# Patient Record
Sex: Female | Born: 1995 | Race: Black or African American | Hispanic: No | Marital: Single | Smoking: Never smoker
Health system: Southern US, Community
[De-identification: ages and names within clinical notes are randomized; demographics above are authoritative.]

## PROBLEM LIST (undated history)

## (undated) ENCOUNTER — Emergency Department (HOSPITAL_COMMUNITY): Payer: BC Managed Care – PPO

## (undated) DIAGNOSIS — D649 Anemia, unspecified: Secondary | ICD-10-CM

## (undated) DIAGNOSIS — N83209 Unspecified ovarian cyst, unspecified side: Secondary | ICD-10-CM

## (undated) DIAGNOSIS — F419 Anxiety disorder, unspecified: Secondary | ICD-10-CM

## (undated) DIAGNOSIS — F329 Major depressive disorder, single episode, unspecified: Secondary | ICD-10-CM

## (undated) DIAGNOSIS — Z87440 Personal history of urinary (tract) infections: Secondary | ICD-10-CM

## (undated) DIAGNOSIS — Z975 Presence of (intrauterine) contraceptive device: Secondary | ICD-10-CM

## (undated) DIAGNOSIS — Z8709 Personal history of other diseases of the respiratory system: Secondary | ICD-10-CM

## (undated) DIAGNOSIS — F32A Depression, unspecified: Secondary | ICD-10-CM

## (undated) HISTORY — PX: OTHER SURGICAL HISTORY: SHX169

## (undated) HISTORY — PX: HERNIA REPAIR: SHX51

## (undated) HISTORY — DX: Anxiety disorder, unspecified: F41.9

## (undated) HISTORY — DX: Major depressive disorder, single episode, unspecified: F32.9

## (undated) HISTORY — DX: Personal history of urinary (tract) infections: Z87.440

## (undated) HISTORY — DX: Depression, unspecified: F32.A

## (undated) HISTORY — DX: Presence of (intrauterine) contraceptive device: Z97.5

## (undated) HISTORY — DX: Personal history of other diseases of the respiratory system: Z87.09

---
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
## 2017-01-25 ENCOUNTER — Ambulatory Visit (INDEPENDENT_AMBULATORY_CARE_PROVIDER_SITE_OTHER): Payer: BC Managed Care – PPO | Admitting: Family Medicine

## 2017-01-25 ENCOUNTER — Encounter: Payer: Self-pay | Admitting: Family Medicine

## 2017-01-25 VITALS — BP 100/70 | HR 54 | Resp 12 | Ht 69.0 in | Wt 136.5 lb

## 2017-01-25 DIAGNOSIS — R001 Bradycardia, unspecified: Secondary | ICD-10-CM

## 2017-01-25 DIAGNOSIS — F331 Major depressive disorder, recurrent, moderate: Secondary | ICD-10-CM | POA: Insufficient documentation

## 2017-01-25 DIAGNOSIS — F419 Anxiety disorder, unspecified: Secondary | ICD-10-CM | POA: Insufficient documentation

## 2017-01-25 MED ORDER — ESCITALOPRAM OXALATE 10 MG PO TABS: 10.0000 mg | ORAL_TABLET | Freq: Every day | ORAL | 1 refills | Status: DC

## 2017-01-25 NOTE — Progress Notes (Signed)
HPI:   [name] is a 21 y.o. female, who is here today to establish care with me.  Former PCP: N/A Last preventive routine visit: 2 years ago, she saw her pediatrician. She follows with Dr Su Hilt, gyn, regularly.  Chronic medical problems:Recurrent UTI's and anxiety.  She is going to her 3rd year college at [employer], her major is psychology business concentration. She is here in town living with parents during school break.  Concerns today: Anxiety   She is reporting years Hx of anxiety and depression, her pediatrician was treating her with Vyvanse and Zoloft. She states that she was initially started on Vuvanse low dose,which aggravated her anxiety, so Zoloft was added. She took Zoloft for 1.5 years but did not feel like it help.  She denies any Hx of ADD or ADHD, states that her parents were concerned about her academic performance at the time. She was a honored students until she suffered head concussion in 2014, then she had some issues with learning, she did not go back to her baseline but still able to graduate with her class above average.  She attributes anxiety and depression to a series of events that took place while growing up. She starts with telling me that her parents are professionals, she is the only child, so she felt a lot of pressure when growing up in regard to academic achievements. She went to private school with "white kids", frequently mean and looking at her like she was different.She denies physical abuse. States that she was not able to overcome all these difficulties and "let things go." She received counseling at schools, reports suicidal thoughts while she was in high school but never attempt or had a plan. She has tried to talk with her parents a few times but they do not understand, usually their comments towards her make her feel worse. If she does not do what they ask her to do she is told she will not be  successful. She tells me that what she "really wanted so bad" was to play volleyball , she was very good, she was accepted in several schools to play with their teams but her parents did not allow and "mde" her quit volleyball for good. She is now in dancing classes,which she used to do but her parents do not approve it.  She sleeps well, 5-6 hours, takes a daily an hour nap. Sexually active, states that it can happen at any time if she is dating but "always" uses condom.  She thinks her mother may have depression issues. States that her parents do not believe in mood/psych disorders. She did not do well this year, she missed classes, did not feel like going to class. Her best friend has Hx of bipolar, well controlled with medication. She denies tobacco use,alcohol use,or illicit drug use.   Review of Systems  Constitutional: Negative for activity change, appetite change, fatigue, fever and unexpected weight change.  HENT: Negative for mouth sores, nosebleeds and trouble swallowing.   Respiratory: Negative for shortness of breath and wheezing.   Cardiovascular: Negative for chest pain, palpitations and leg swelling.  Gastrointestinal: Negative for abdominal pain, nausea and vomiting.       Negative for changes in bowel habits.  Endocrine: Negative for cold intolerance and heat intolerance.  Genitourinary: Negative for decreased urine volume and hematuria.  Neurological: Negative for tremors, syncope, weakness and headaches.  Psychiatric/Behavioral: Negative for confusion, hallucinations, sleep disturbance and suicidal ideas.  The patient is nervous/anxious.      No current outpatient prescriptions on file prior to visit.   No current facility-administered medications on file prior to visit.     Past Medical History:  Diagnosis Date  . Anxiety   . Depression   . History of frequent urinary tract infections    No Known Allergies  Family History  Problem Relation Age of Onset  .  Diabetes Father   . Cancer Maternal Grandfather        prostate    Social History   Social History  . Marital status: Single    Spouse name: N/A  . Number of children: N/A  . Years of education: N/A   Social History Main Topics  . Smoking status: Never Smoker  . Smokeless tobacco: Never Used  . Alcohol use No  . Drug use: No  . Sexual activity: Yes    Birth control/ protection: Condom   Other Topics Concern  . None   Social History Narrative  . None    Vitals:   01/25/17 1522  BP: 100/70  Pulse: (!) 54  Resp: 12   O2 sat at RA 98% Body mass index is 20.16 kg/m.   Physical Exam  Nursing note and vitals reviewed. Constitutional: She is oriented to person, place, and time. She appears well-developed and well-nourished. No distress.  HENT:  Head: Atraumatic.  Mouth/Throat: Oropharynx is clear and moist and mucous membranes are normal.  Eyes: Conjunctivae and EOM are normal. Pupils are equal, round, and reactive to light.  Neck: No tracheal deviation present. No thyroid mass and no thyromegaly present.  Cardiovascular: Regular rhythm.  Bradycardia present.   No murmur heard. Pulses:      Dorsalis pedis pulses are 2+ on the right side, and 2+ on the left side.  Respiratory: Effort normal and breath sounds normal. No respiratory distress.  GI: Soft. She exhibits no mass. There is no hepatomegaly. There is no tenderness.  Musculoskeletal: She exhibits no edema.  Lymphadenopathy:    She has no cervical adenopathy.  Neurological: She is alert and oriented to person, place, and time. She has normal strength. Coordination and gait normal.  Skin: Skin is warm. No erythema.  Psychiatric: Her mood appears anxious. Her affect is labile. She expresses no suicidal ideation. She expresses no suicidal plans.  Well groomed, good eye contact.      ASSESSMENT AND PLAN:   Susette was seen today for establish care.  Diagnoses and all orders for this visit:  Depression,  major, recurrent, moderate (HCC)  We discussed treatment options and some side effects. She agrees with trying Lexapro. Denies suicidal thoughts at this time, clearly instructed about warning signs. F/U in 3-4 weeks.  -     escitalopram (LEXAPRO) 10 MG tablet; Take 1 tablet (10 mg total) by mouth daily.  Anxiety disorder, unspecified type  She is interested in psychotherapy, recommend setting up appt with Dr Jason FilaBray.  -     escitalopram (LEXAPRO) 10 MG tablet; Take 1 tablet (10 mg total) by mouth daily.  Sinus bradycardia  Asymptomatic. Next OV planning on lab work.  Instructed about warning signs.  36 min face to face OV. > 50% was dedicated to counseling about Dx's, treatment options, and discussion of side effects of medication.She voices understanding and agrees with plan. Strategies for better communication with parents, learning to understand their position but at the same time working on her own goals.      Timoteo ExposeBetty G.  [place], [place]. [place] office.

## 2017-01-25 NOTE — Patient Instructions (Signed)
A few things to remember from today's visit:   Anxiety disorder, unspecified type - Plan: escitalopram (LEXAPRO) 10 MG tablet  Today we started Lexapro, this type of medications can increase suicidal risk. This is more prevalent among children,adolecents, and young adults with major depression or other psychiatric disorders. It can also make depression worse. Most common side effects are gastrointestinal, self limited after a few weeks: diarrhea, nausea, constipation  Or diarrhea among some.  In general it is well tolerated. We will follow closely.       Please be sure medication list is accurate. If a new problem present, please set up appointment sooner than planned today.

## 2017-02-04 ENCOUNTER — Ambulatory Visit (INDEPENDENT_AMBULATORY_CARE_PROVIDER_SITE_OTHER): Payer: Self-pay | Admitting: Nurse Practitioner

## 2017-02-04 VITALS — BP 98/82 | HR 61 | Temp 99.3°F | Wt 134.0 lb

## 2017-02-04 DIAGNOSIS — J029 Acute pharyngitis, unspecified: Secondary | ICD-10-CM

## 2017-02-04 DIAGNOSIS — J028 Acute pharyngitis due to other specified organisms: Secondary | ICD-10-CM

## 2017-02-04 DIAGNOSIS — B9789 Other viral agents as the cause of diseases classified elsewhere: Secondary | ICD-10-CM

## 2017-02-04 LAB — POCT RAPID STREP A (OFFICE): Rapid Strep A Screen: NEGATIVE

## 2017-02-04 MED ORDER — MAGIC MOUTHWASH W/LIDOCAINE: 5.0000 mL | Freq: Three times a day (TID) | ORAL | 0 refills | Status: AC | PRN

## 2017-02-04 NOTE — Patient Instructions (Addendum)
Sore Throat A sore throat is pain, burning, irritation, or scratchiness in the throat. When you have a sore throat, you may feel pain or tenderness in your throat when you swallow or talk. Many things can cause a sore throat, including:  An infection.  Seasonal allergies.  Dryness in the air.  Irritants, such as smoke or pollution.  Gastroesophageal reflux disease (GERD).  A tumor.  A sore throat is often the first sign of another sickness. It may happen with other symptoms, such as coughing, sneezing, fever, and swollen neck glands. Most sore throats go away without medical treatment. Follow these instructions at home:  Take over-the-counter medicines only as told by your health care provider.  Drink enough fluids to keep your urine clear or pale yellow.  Saltwater gargles to help with throat pain.  Rest as needed.  To help with pain, try: ? Sipping warm liquids, such as broth, herbal tea, or warm water. ? Eating or drinking cold or frozen liquids, such as frozen ice pops. ? Gargling with a salt-water mixture 3-4 times a day or as needed. To make a salt-water mixture, completely dissolve -1 tsp of salt in 1 cup of warm water. ? Sucking on hard candy or throat lozenges. ? Putting a cool-mist humidifier in your bedroom at night to moisten the air. ? Sitting in the bathroom with the door closed for 5-10 minutes while you run hot water in the shower.  Do not use any tobacco products, such as cigarettes, chewing tobacco, and e-cigarettes. If you need help quitting, ask your health care provider.  Ibuprofen or Tylenol for pain, fever or general discomfort.  Use Magic Mouthwash as directed for severe throat pain. Contact a health care provider if:  You have a fever for more than 2-3 days.  You have symptoms that last (are persistent) for more than 2-3 days.  Your throat does not get better within 7 days.  You have a fever and your symptoms suddenly get worse. Get help  right away if:  You have difficulty breathing.  You cannot swallow fluids, soft foods, or your saliva.  You have increased swelling in your throat or neck.  You have persistent nausea and vomiting. This information is not intended to replace advice given to you by your health care provider. Make sure you discuss any questions you have with your health care provider. Document Released: 10/01/2004 Document Revised: 04/19/2016 Document Reviewed: 06/14/2015 Elsevier Interactive Patient Education  [employer]2018 Elsevier Inc.

## 2017-02-04 NOTE — Progress Notes (Signed)
Subjective:     Ruth Lewis is a 21 y.o. female who presents for evaluation of sore throat. Associated symptoms include sore throat and swollen glands. Onset of symptoms was today, and have been unchanged since that time. She is drinking plenty of fluids. She has had a recent close exposure to someone with proven streptococcal pharyngitis.  The patient's last diagnosis of strep throat was two years ago at which time she had 3 occurrences.    The following portions of the patient's history were reviewed and updated as appropriate: allergies, current medications and past medical history.  Review of Systems Constitutional: positive for fatigue Eyes: negative Ears, nose, mouth, throat, and face: positive for sore throat, voice change and swollen glands Respiratory: negative Cardiovascular: negative Allergic/Immunologic: negative    Objective:    BP 98/82 (BP Location: Left Arm, Patient Position: Sitting, Cuff Size: Normal)   Pulse 61   Temp 99.3 F (37.4 C) (Oral)   Wt 134 lb (60.8 kg)   BMI 19.79 kg/m  General appearance: alert, cooperative and no distress Head: Normocephalic, without obvious abnormality, atraumatic Eyes: conjunctivae/corneas clear. PERRL, EOM's intact. Fundi benign. Ears: normal TM's and external ear canals both ears Nose: Nares normal. Septum midline. Mucosa normal. No drainage or sinus tenderness. Throat: abnormal findings: moderate oropharyngeal erythema and no exudate Lungs: clear to auscultation bilaterally Heart: regular rate and rhythm, S1, S2 normal, no murmur, click, rub or gallop Lymph nodes: Cervical adenopathy: bilaterally, mild  Laboratory Strep test performed. Results:negative.    Assessment:   Viral pharyngitis.    Plan:    Use of OTC analgesics recommended as well as salt water gargles. Magic mouthwash, take as directed.  Ibuprofen or Tylenol for pain, fever or general discomfort.  Plenty of fluids.  Go to ER if difficulty swallowing,  breathing or other concerns.  Patient verbalizes instructions.

## 2017-02-05 ENCOUNTER — Telehealth: Payer: Self-pay | Admitting: Nurse Practitioner

## 2017-02-05 NOTE — Telephone Encounter (Signed)
Called patient to follow up on her status.  Spoke with patient. Patient states she is feeling much better today and has not need to use the mouthwash.  Informed patient to follow up if needed.

## 2017-02-21 NOTE — Progress Notes (Signed)
HPI:   [name] is a 21 y.o. female, who is here today to follow on recent OV.   She was seen on 01/25/17, when she was concerned about anxiety and depressed mood, which she has had for several years, aggravated by her relation with her parents. [name] in the past, did not help.  [name] 10 mg daily, which she feels has helped.  Psychotherapy was also recommended but she did not have time to set up an appt and does not feel like it will make a difference. She has done psychotherapy in the past, states that she does not have time now. She is working and taking some on line classes.  Sleeping better,  11:30 pm 9 am, feels rested. "Choosing her battle" now, able to controlled her impulsive "mood", irritability, not arguing with parents as much as she did before. No suicidal thought.  She has no concerns today.   Review of Systems  Constitutional: Negative for activity change, appetite change, fatigue and unexpected weight change.  Respiratory: Negative for chest tightness and shortness of breath.   Cardiovascular: Negative for chest pain and palpitations.  Gastrointestinal: Negative for abdominal pain, diarrhea, nausea and vomiting.  Skin: Negative for rash.  Neurological: Negative for tremors, seizures and headaches.  Psychiatric/Behavioral: Negative for confusion, hallucinations, sleep disturbance and suicidal ideas. The patient is not nervous/anxious.       Current Outpatient Prescriptions on File Prior to Visit  Medication Sig Dispense Refill  . escitalopram (LEXAPRO) 10 MG tablet Take 1 tablet (10 mg total) by mouth daily. 30 tablet 1   No current facility-administered medications on file prior to visit.      Past Medical History:  Diagnosis Date  . Anxiety   . Depression   . History of frequent urinary tract infections    No Known Allergies  Social History   Social History  . Marital status: Unknown    Spouse  name: N/A  . Number of children: N/A  . Years of education: N/A   Social History Main Topics  . Smoking status: Never Smoker  . Smokeless tobacco: Never Used  . Alcohol use No  . Drug use: No  . Sexual activity: Yes    Birth control/ protection: Condom   Other Topics Concern  . None   Social History Narrative  . None    Vitals:   02/22/17 1033  BP: 100/70  Pulse: 62  Resp: 12   Body mass index is 20.31 kg/m.   Wt Readings from Last 3 Encounters:  02/22/17 137 lb 8 oz (62.4 kg)  02/04/17 134 lb (60.8 kg)  01/25/17 136 lb 8 oz (61.9 kg)     Physical Exam  Nursing note and vitals reviewed. Constitutional: She is oriented to person, place, and time. She appears well-developed. No distress.  HENT:  Mouth/Throat: Oropharynx is clear and moist and mucous membranes are normal.  Eyes: Conjunctivae are normal.  Cardiovascular: Normal rate and regular rhythm.   No murmur heard. Respiratory: Effort normal and breath sounds normal. No respiratory distress.  Musculoskeletal: She exhibits no edema.  Neurological: She is alert and oriented to person, place, and time. She has normal strength. Gait normal.  Skin: Skin is warm. No erythema.  Psychiatric: She has a normal mood and affect. Her speech is normal. Cognition and memory are normal. She expresses no suicidal ideation. She expresses no suicidal plans.  Well groomed,good eye contact.  ASSESSMENT AND PLAN:   Gabreille was seen today for follow-up.  Diagnoses and all orders for this visit:  Anxiety disorder, unspecified type  Depression, major, recurrent, moderate (HCC)   Reporting improvement of symptoms and mood with Lexapro 10 mg. She is tolerating med well, no maniac like symptoms and sleeping better. She is not interested in psychotherapy for now.  No changes in Lexapro dose.  Instructed about warning signs. F/U in 3-4 months,before if needed.     Jahquez Steffler G. [place], MD  [facility]. [place]  office.

## 2017-02-22 ENCOUNTER — Encounter: Payer: Self-pay | Admitting: Family Medicine

## 2017-02-22 ENCOUNTER — Ambulatory Visit (INDEPENDENT_AMBULATORY_CARE_PROVIDER_SITE_OTHER): Payer: BC Managed Care – PPO | Admitting: Family Medicine

## 2017-02-22 VITALS — BP 100/70 | HR 62 | Resp 12 | Ht 69.0 in | Wt 137.5 lb

## 2017-02-22 DIAGNOSIS — F419 Anxiety disorder, unspecified: Secondary | ICD-10-CM

## 2017-02-22 DIAGNOSIS — F331 Major depressive disorder, recurrent, moderate: Secondary | ICD-10-CM | POA: Diagnosis not present

## 2017-02-22 MED ORDER — ESCITALOPRAM OXALATE 10 MG PO TABS: 10.0000 mg | ORAL_TABLET | Freq: Every day | ORAL | 1 refills | Status: DC

## 2017-02-22 NOTE — Patient Instructions (Addendum)
A few things to remember from today's visit:   Anxiety disorder, unspecified type - Plan: escitalopram (LEXAPRO) 10 MG tablet  Depression, major, recurrent, moderate (HCC) - Plan: escitalopram (LEXAPRO) 10 MG tablet   [name], today we have followed on some of your chronic medical problems and they seem to be stable, so no changes in current management today.  Review medication list and be sure it is accurate.  -Remember a healthy diet and regular physical activity are very important for prevention as well as for well being; they also help with many chronic problems, decreasing the need of adding new medications and delaying or preventing possible complications.  I will see you back in 4 months.  Remember to arrange your follow up appt before leaving today.  Please follow sooner than planned if a new concern arises.  Please be sure medication list is accurate. If a new problem present, please set up appointment sooner than planned today.

## 2017-03-31 ENCOUNTER — Other Ambulatory Visit: Payer: Self-pay | Admitting: Obstetrics and Gynecology

## 2017-04-01 ENCOUNTER — Encounter (HOSPITAL_COMMUNITY): Payer: Self-pay | Admitting: *Deleted

## 2017-04-05 NOTE — H&P (Signed)
Ruth Lewis is a 21 y.o.  female, G: 0 who  presents for ovarian cystectomy because of  a persistent and enlarging right ovarian cyst.  Over the past 3 years the patient has been evaluated for irregular bleeding that would, at times lasts for 40 consecutive days and require her to change her pad 4 times a day.  Additionally, she would experience menstrual cramping that she rated 7/10 on a 10 point pain scale with minimal relief from over the counter Advil. Consequently she was placed on Nexplanon in 2016 with some improvement in her bleeding pattern but the irregularity continued. In January 2018 the patient had a sono-hysterogram that showed:  anteverted uterus measuring 5.33 x 3.87 x 2.80 cm, endometrium 0.86 mm with no lesions; left ovary: 2.91 cm and right ovary: 4.43 cm with a 3.2 cm simple appearing cyst with normal Dopplers.  The patient denies any pelvic pain (that is not associated with bleeding), changes in bowel or bladder function or lower back pain.  A repeat pelvic ultrasound in July 2018 revealed an increase in the right ovarian cyst to 5.2 cm. Patient's TSH, Prolactin and  Ca-125 were within normal range and H/H = 14.2/43.1.  A review of both medical and surgical management options were given to the patient for management of her ovarian cyst, however, given the progression of growth, the patient has opted to have her cyst removed.   Past Medical History  OB History: G:0  GYN History: menarche: 21 YO    LMP: 03/19/2016    Contracepton Nexplanon  The patient denies history of sexually transmitted disease.    Medical History:  Anxiety, Vitamin D Deficiency and Leukopenia,   Surgical History: 2000 Umbilical Hernia Repair Denies problems with anesthesia or history of blood transfusions  Family History:  Anemia, Hypertension, Diabetes Mellitus and Prostate Cancer   Social History:  Single and a Consulting civil engineertudent at [employer];  Denies tobacco or alcohol  use   Medications: Vitamin D 3 50,000 units weekly Lexapro 10 mg  daily Nexplanon 68 mg Sub-dermal Implant (placed 04/15/15)   No Known Allergies   Denies sensitivity to peanuts, shellfish, soy, latex or adhesives.   ROS: Admits to glasses but denies headache, vision changes, nasal congestion, dysphagia, tinnitus, dizziness, hoarseness, cough,  chest pain, shortness of breath, nausea, vomiting, diarrhea,constipation,  urinary frequency, urgency  dysuria, hematuria, vaginitis symptoms, pelvic pain, swelling of joints,easy bruising,  myalgias, arthralgias, skin rashes, unexplained weight loss and except as is mentioned in the history of present illness, patient's review of systems is otherwise negative.   Physical Exam  Bp: 106/88    P: 60  bpm  Temperature:  99.14 degree F orally    Weight:  141 lbs.  Height: 5\' 9"   BMI: 20.8  Neck: supple without masses or thyromegaly Lungs: clear to auscultation Heart: regular rate and rhythm Abdomen: soft, non-tender and no organomegaly Pelvic: patient refused pelvic exam Extremities:  no clubbing, cyanosis or edema   Assesment:  Persistent, Enlarging Right Ovarian Cyst   Disposition:  A discussion was held with patient regarding the indication for her procedure(s) along with the risks, which include but are not limited to: reaction to anesthesia, damage to adjacent organs, infection and excessive bleeding.  The patient verbalized understanding of these risks and has consented to proceed with a Laparoscopic Right [facility]ian [facility] on April 07, 2017 at 10:30 a.m.    CSN# [id number]   Tori Dattilio J. Lowell GuitarPowell, PA-C  for Dr. Woodroe ModeAngela Y. Su Hiltoberts

## 2017-04-07 ENCOUNTER — Encounter (HOSPITAL_COMMUNITY)
Admission: RE | Disposition: A | Discharge status: Home / Self Care | Payer: Self-pay | Source: Ambulatory Visit | Attending: Obstetrics and Gynecology

## 2017-04-07 ENCOUNTER — Ambulatory Visit (HOSPITAL_COMMUNITY): Payer: BC Managed Care – PPO | Admitting: Anesthesiology

## 2017-04-07 ENCOUNTER — Encounter (HOSPITAL_COMMUNITY): Payer: Self-pay | Admitting: Certified Registered Nurse Anesthetist

## 2017-04-07 ENCOUNTER — Ambulatory Visit (HOSPITAL_COMMUNITY)
Admission: RE | Admit: 2017-04-07 | Discharge: 2017-04-07 | Disposition: A | Discharge status: Home / Self Care | Payer: BC Managed Care – PPO | Source: Ambulatory Visit | Attending: Obstetrics and Gynecology | Admitting: Obstetrics and Gynecology

## 2017-04-07 DIAGNOSIS — F329 Major depressive disorder, single episode, unspecified: Secondary | ICD-10-CM | POA: Diagnosis not present

## 2017-04-07 DIAGNOSIS — Z79899 Other long term (current) drug therapy: Secondary | ICD-10-CM | POA: Diagnosis not present

## 2017-04-07 DIAGNOSIS — Z793 Long term (current) use of hormonal contraceptives: Secondary | ICD-10-CM | POA: Diagnosis not present

## 2017-04-07 DIAGNOSIS — F419 Anxiety disorder, unspecified: Secondary | ICD-10-CM | POA: Diagnosis not present

## 2017-04-07 DIAGNOSIS — N83201 Unspecified ovarian cyst, right side: Secondary | ICD-10-CM

## 2017-04-07 DIAGNOSIS — E559 Vitamin D deficiency, unspecified: Secondary | ICD-10-CM | POA: Insufficient documentation

## 2017-04-07 DIAGNOSIS — N8301 Follicular cyst of right ovary: Secondary | ICD-10-CM | POA: Diagnosis not present

## 2017-04-07 HISTORY — DX: Anemia, unspecified: D64.9

## 2017-04-07 HISTORY — DX: Unspecified ovarian cyst, unspecified side: N83.209

## 2017-04-07 HISTORY — PX: LAPAROSCOPIC OVARIAN CYSTECTOMY: SHX6248

## 2017-04-07 LAB — CBC
HCT: 41 % (ref 36.0–46.0)
Hemoglobin: 13.8 g/dL (ref 12.0–15.0)
MCH: 29.6 pg (ref 26.0–34.0)
MCHC: 33.7 g/dL (ref 30.0–36.0)
MCV: 88 fL (ref 78.0–100.0)
PLATELETS: 243 10*3/uL (ref 150–400)
RBC: 4.66 MIL/uL (ref 3.87–5.11)
RDW: 13.9 % (ref 11.5–15.5)
WBC: 3.4 10*3/uL — AB (ref 4.0–10.5)

## 2017-04-07 LAB — HCG, SERUM, QUALITATIVE: PREG SERUM: NEGATIVE

## 2017-04-07 SURGERY — EXCISION, CYST, OVARY, LAPAROSCOPIC
Anesthesia: General | Site: Abdomen | Laterality: Right

## 2017-04-07 MED ORDER — SCOPOLAMINE 1 MG/3DAYS TD PT72
MEDICATED_PATCH | TRANSDERMAL | Status: AC
  Filled 2017-04-07: qty 1

## 2017-04-07 MED ORDER — SCOPOLAMINE 1 MG/3DAYS TD PT72
1.0000 | MEDICATED_PATCH | Freq: Once | TRANSDERMAL | Status: DC
Start: 2017-04-07 — End: 2017-04-07
  Administered 2017-04-07: 1.5 mg via TRANSDERMAL

## 2017-04-07 MED ORDER — PROPOFOL 10 MG/ML IV BOLUS
INTRAVENOUS | Status: DC | PRN
  Administered 2017-04-07: 200 mg via INTRAVENOUS

## 2017-04-07 MED ORDER — FENTANYL CITRATE (PF) 100 MCG/2ML IJ SOLN
INTRAMUSCULAR | Status: AC
  Filled 2017-04-07: qty 2

## 2017-04-07 MED ORDER — CEFAZOLIN SODIUM-DEXTROSE 2-3 GM-% IV SOLR
INTRAVENOUS | Status: DC | PRN
  Administered 2017-04-07: 2 g via INTRAVENOUS

## 2017-04-07 MED ORDER — BUPIVACAINE HCL (PF) 0.25 % IJ SOLN
INTRAMUSCULAR | Status: DC | PRN
  Administered 2017-04-07: 18 mL

## 2017-04-07 MED ORDER — HYDROCODONE-ACETAMINOPHEN 5-325 MG PO TABS: ORAL_TABLET | ORAL | 0 refills | Status: DC

## 2017-04-07 MED ORDER — LACTATED RINGERS IV SOLN
INTRAVENOUS | Status: DC
  Administered 2017-04-07: 12:00:00 via INTRAVENOUS
  Administered 2017-04-07: 125 mL/h via INTRAVENOUS

## 2017-04-07 MED ORDER — MIDAZOLAM HCL 2 MG/2ML IJ SOLN
INTRAMUSCULAR | Status: AC
  Filled 2017-04-07: qty 2

## 2017-04-07 MED ORDER — FENTANYL CITRATE (PF) 100 MCG/2ML IJ SOLN
INTRAMUSCULAR | Status: DC | PRN
  Administered 2017-04-07 (×2): 50 ug via INTRAVENOUS
  Administered 2017-04-07: 100 ug via INTRAVENOUS
  Administered 2017-04-07: 50 ug via INTRAVENOUS

## 2017-04-07 MED ORDER — KETOROLAC TROMETHAMINE 30 MG/ML IJ SOLN
INTRAMUSCULAR | Status: AC
  Filled 2017-04-07: qty 1

## 2017-04-07 MED ORDER — BUPIVACAINE HCL (PF) 0.25 % IJ SOLN
INTRAMUSCULAR | Status: AC
  Filled 2017-04-07: qty 30

## 2017-04-07 MED ORDER — LIDOCAINE HCL (PF) 1 % IJ SOLN
INTRAMUSCULAR | Status: AC
  Filled 2017-04-07: qty 5

## 2017-04-07 MED ORDER — ONDANSETRON HCL 4 MG/2ML IJ SOLN: 4.0000 mg | Freq: Once | INTRAMUSCULAR | Status: DC | PRN

## 2017-04-07 MED ORDER — HYDROCODONE-ACETAMINOPHEN 5-325 MG PO TABS
ORAL_TABLET | ORAL | Status: AC
  Filled 2017-04-07: qty 1

## 2017-04-07 MED ORDER — MIDAZOLAM HCL 2 MG/2ML IJ SOLN
INTRAMUSCULAR | Status: DC | PRN
  Administered 2017-04-07: 2 mg via INTRAVENOUS

## 2017-04-07 MED ORDER — KETOROLAC TROMETHAMINE 30 MG/ML IJ SOLN
INTRAMUSCULAR | Status: DC | PRN
  Administered 2017-04-07: 30 mg via INTRAVENOUS

## 2017-04-07 MED ORDER — DEXAMETHASONE SODIUM PHOSPHATE 10 MG/ML IJ SOLN
INTRAMUSCULAR | Status: DC | PRN
  Administered 2017-04-07: 10 mg via INTRAVENOUS

## 2017-04-07 MED ORDER — ROCURONIUM BROMIDE 100 MG/10ML IV SOLN
INTRAVENOUS | Status: DC | PRN
  Administered 2017-04-07: 5 mg via INTRAVENOUS
  Administered 2017-04-07: 40 mg via INTRAVENOUS
  Administered 2017-04-07: 5 mg via INTRAVENOUS

## 2017-04-07 MED ORDER — ONDANSETRON HCL 4 MG/2ML IJ SOLN
INTRAMUSCULAR | Status: AC
  Filled 2017-04-07: qty 2

## 2017-04-07 MED ORDER — ROCURONIUM BROMIDE 100 MG/10ML IV SOLN
INTRAVENOUS | Status: AC
  Filled 2017-04-07: qty 1

## 2017-04-07 MED ORDER — GLYCOPYRROLATE 0.2 MG/ML IJ SOLN
INTRAMUSCULAR | Status: AC
  Filled 2017-04-07: qty 1

## 2017-04-07 MED ORDER — ONDANSETRON HCL 4 MG/2ML IJ SOLN
INTRAMUSCULAR | Status: DC | PRN
  Administered 2017-04-07: 4 mg via INTRAVENOUS

## 2017-04-07 MED ORDER — HYDROCODONE-ACETAMINOPHEN 5-325 MG PO TABS
1.0000 | ORAL_TABLET | Freq: Once | ORAL | Status: AC
Start: 2017-04-07 — End: 2017-04-07
  Administered 2017-04-07: 1 via ORAL

## 2017-04-07 MED ORDER — GLYCOPYRROLATE 0.2 MG/ML IJ SOLN
INTRAMUSCULAR | Status: DC | PRN
  Administered 2017-04-07: 0.2 mg via INTRAVENOUS

## 2017-04-07 MED ORDER — FENTANYL CITRATE (PF) 100 MCG/2ML IJ SOLN
25.0000 ug | INTRAMUSCULAR | Status: DC | PRN
  Administered 2017-04-07: 25 ug via INTRAVENOUS

## 2017-04-07 MED ORDER — DEXAMETHASONE SODIUM PHOSPHATE 4 MG/ML IJ SOLN
INTRAMUSCULAR | Status: AC
  Filled 2017-04-07: qty 1

## 2017-04-07 MED ORDER — IBUPROFEN 600 MG PO TABS: ORAL_TABLET | ORAL | 1 refills | Status: DC

## 2017-04-07 MED ORDER — SUGAMMADEX SODIUM 200 MG/2ML IV SOLN
INTRAVENOUS | Status: DC | PRN
  Administered 2017-04-07: 150 mg via INTRAVENOUS

## 2017-04-07 MED ORDER — LIDOCAINE HCL (CARDIAC) 20 MG/ML IV SOLN
INTRAVENOUS | Status: DC | PRN
  Administered 2017-04-07: 50 mg via INTRAVENOUS

## 2017-04-07 MED ORDER — FENTANYL CITRATE (PF) 250 MCG/5ML IJ SOLN
INTRAMUSCULAR | Status: AC
  Filled 2017-04-07: qty 5

## 2017-04-07 MED ORDER — PROPOFOL 10 MG/ML IV BOLUS
INTRAVENOUS | Status: AC
  Filled 2017-04-07: qty 20

## 2017-04-07 MED ORDER — MEPERIDINE HCL 25 MG/ML IJ SOLN: 6.2500 mg | INTRAMUSCULAR | Status: DC | PRN

## 2017-04-07 MED ORDER — SODIUM CHLORIDE 0.9 % IR SOLN
Status: DC | PRN
  Administered 2017-04-07: 1000 mL

## 2017-04-07 MED ORDER — CEFAZOLIN SODIUM-DEXTROSE 2-4 GM/100ML-% IV SOLN
INTRAVENOUS | Status: AC
  Filled 2017-04-07: qty 100

## 2017-04-07 MED ORDER — SUGAMMADEX SODIUM 200 MG/2ML IV SOLN
INTRAVENOUS | Status: AC
  Filled 2017-04-07: qty 2

## 2017-04-07 SURGICAL SUPPLY — 39 items
ADH SKN CLS APL DERMABOND .7 (GAUZE/BANDAGES/DRESSINGS) ×1
BAG SPEC RTRVL LRG 6X4 10 (ENDOMECHANICALS)
BARRIER ADHS 3X4 INTERCEED (GAUZE/BANDAGES/DRESSINGS) ×2 IMPLANT
BRR ADH 4X3 ABS CNTRL BYND (GAUZE/BANDAGES/DRESSINGS) ×1
CABLE HIGH FREQUENCY MONO STRZ (ELECTRODE) ×2 IMPLANT
CLOTH BEACON ORANGE TIMEOUT ST (SAFETY) ×3 IMPLANT
DERMABOND ADVANCED (GAUZE/BANDAGES/DRESSINGS) ×2
DERMABOND ADVANCED .7 DNX12 (GAUZE/BANDAGES/DRESSINGS) ×1 IMPLANT
DRSG OPSITE POSTOP 3X4 (GAUZE/BANDAGES/DRESSINGS) ×2 IMPLANT
DURAPREP 26ML APPLICATOR (WOUND CARE) ×3 IMPLANT
FORCEPS CUTTING 33CM 5MM (CUTTING FORCEPS) IMPLANT
GLOVE BIO SURGEON STRL SZ7.5 (GLOVE) ×3 IMPLANT
GLOVE BIOGEL PI IND STRL 7.0 (GLOVE) ×2 IMPLANT
GLOVE BIOGEL PI IND STRL 7.5 (GLOVE) ×2 IMPLANT
GLOVE BIOGEL PI INDICATOR 7.0 (GLOVE) ×4
GLOVE BIOGEL PI INDICATOR 7.5 (GLOVE) ×4
GOWN STRL REUS W/TWL LRG LVL3 (GOWN DISPOSABLE) ×6 IMPLANT
NEEDLE INSUFFLATION 120MM (ENDOMECHANICALS) ×3 IMPLANT
NS IRRIG 1000ML POUR BTL (IV SOLUTION) ×3 IMPLANT
PACK LAPAROSCOPY BASIN (CUSTOM PROCEDURE TRAY) ×3 IMPLANT
PACK TRENDGUARD 450 HYBRID PRO (MISCELLANEOUS) IMPLANT
PACK TRENDGUARD 600 HYBRD PROC (MISCELLANEOUS) IMPLANT
POUCH SPECIMEN RETRIEVAL 10MM (ENDOMECHANICALS) IMPLANT
PROTECTOR NERVE ULNAR (MISCELLANEOUS) ×6 IMPLANT
SET IRRIG TUBING LAPAROSCOPIC (IRRIGATION / IRRIGATOR) IMPLANT
SLEEVE XCEL OPT CAN 5 100 (ENDOMECHANICALS) ×4 IMPLANT
SOLUTION ELECTROLUBE (MISCELLANEOUS) IMPLANT
SUT MNCRL AB 3-0 PS2 27 (SUTURE) ×3 IMPLANT
SUT VICRYL 0 ENDOLOOP (SUTURE) IMPLANT
SUT VICRYL 0 TIES 12 18 (SUTURE) IMPLANT
SUT VICRYL 0 UR6 27IN ABS (SUTURE) ×3 IMPLANT
SYR 50ML LL SCALE MARK (SYRINGE) IMPLANT
TOWEL OR 17X24 6PK STRL BLUE (TOWEL DISPOSABLE) ×6 IMPLANT
TRAY FOLEY CATH SILVER 14FR (SET/KITS/TRAYS/PACK) ×3 IMPLANT
TRENDGUARD 450 HYBRID PRO PACK (MISCELLANEOUS) ×3
TRENDGUARD 600 HYBRID PROC PK (MISCELLANEOUS)
TROCAR XCEL NON-BLD 11X100MML (ENDOMECHANICALS) ×3 IMPLANT
TROCAR XCEL NON-BLD 5MMX100MML (ENDOMECHANICALS) ×3 IMPLANT
WARMER LAPAROSCOPE (MISCELLANEOUS) ×3 IMPLANT

## 2017-04-07 NOTE — Interval H&P Note (Signed)
History and Physical Interval Note:  04/07/2017 10:22 AM  Ruth Lewis  has presented today for surgery, with the diagnosis of RIGHT OVARIAN CYST  The various methods of treatment have been discussed with the patient and family. After consideration of risks, benefits and other options for treatment, the patient has consented to  Procedure(s): LAPAROSCOPIC OVARIAN CYSTECTOMY (Right) as a surgical intervention .  The patient's history has been reviewed, patient examined, no change in status, stable for surgery.  I have reviewed the patient's chart and labs.  Questions were answered to the patient's satisfaction.     Purcell NailsOBERTS,Markail Diekman Y

## 2017-04-07 NOTE — Op Note (Signed)
Preop Diagnosis: RIGHT OVARIAN CYST   Postop Diagnosis: RIGHT OVARIAN CYST   Procedure: LAPAROSCOPIC RIGHT OVARIAN CYSTECTOMY   Anesthesia: General   Attending: Osborn Cohooberts, Erice Ahles, MD   Assistant:  E. Lowell GuitarPowell, PA-C  Findings: Multiseptated approx. 3-4cm cyst.  Pathology: Cyst wall  Fluids: 1200 cc  UOP: 600 cc  EBL: 5 cc  Complications: None  Procedure: The patient was taken to the operating room after the risks, benefits, alternatives, complications, treatment options, and expected outcomes were discussed with the patient. The patient verbalized understanding, the patient concurred with the proposed plan and consent signed and witnessed. The patient was taken to the Operating Room and [facility] and the procedure verified as Laparoscopic Right Cystectomy.  The patient was placed under general anesthesia per anesthesia staff, the patient was placed in modified dorsal lithotomy position and was prepped, draped, and catheterized in the normal, sterile fashion.  A Time Out was held and the above information confirmed.  The cervix was visualized and an acorn intrauterine manipulator was placed.  A 10 mm umbilical incision was then performed. Veress needle was passed and pneumoperitoneum was established. A 10 mm trocar was advanced into the intraabdominal cavity, the laparoscope was introduced and findings as noted above.  Patient was placed in trendelenburg and marcaine injected in the LLQ and a 5 mm incision was made and 5 mm trocar advanced into the intraabdominal cavity.  The same was done in the RLQ and in the suprapubic area.   Monopolar scissors were used to incise over each cyst and cysts were dissected out without difficulty.  The ovarian beds were cauterized for hemostasis.  Interceed was placed on the right ovary to help prevent adhesions.  The 5 mm suprapubic trocar was removed.  Right and left trocars were removed as well under direct visualization.   Pneumoperitoneum was relieved and umbilical trocar removed under direct visualization.  The umbilical fascia was repaired with 0 vicryl.  The 10 mm skin incision was repaired with 3-0 monocryl via a subcuticular stitch and dermabond was applied to all incisions.  Intrauterine manipulator was removed.  Sponge, instrument, lap and needle counts were correct.  The patient tolerated the procedure well and was awaiting extubation and transfer to the recovery room in good condition.

## 2017-04-07 NOTE — Anesthesia Postprocedure Evaluation (Signed)
Anesthesia Post Note  Patient: Ruth Lewis  Procedure(s) Performed: Procedure(s) (LRB): LAPAROSCOPIC OVARIAN CYSTECTOMY (Right)     Patient location during evaluation: PACU Anesthesia Type: General Level of consciousness: awake and alert Pain management: pain level controlled Vital Signs Assessment: post-procedure vital signs reviewed and stable Respiratory status: spontaneous breathing, nonlabored ventilation, respiratory function stable and patient connected to nasal cannula oxygen Cardiovascular status: blood pressure returned to baseline and stable Postop Assessment: no signs of nausea or vomiting Anesthetic complications: no    Last Vitals:  Vitals:   04/07/17 0912  BP: 120/81  Pulse: (!) 50  Resp: 16  Temp: 37.1 C    Last Pain:  Vitals:   04/07/17 0912  TempSrc: Oral   Pain Goal: Patients Stated Pain Goal: 5 (04/07/17 0912)               Vennela Jutte

## 2017-04-07 NOTE — Anesthesia Preprocedure Evaluation (Signed)
Anesthesia Evaluation  Patient identified by MRN, date of birth, ID band Patient awake    Reviewed: Allergy & Precautions, H&P , NPO status , Patient's Chart, lab work & pertinent test results, reviewed documented beta blocker date and time   History of Anesthesia Complications (+) history of anesthetic complications  Airway Mallampati: II  TM Distance: >3 FB Neck ROM: Full    Dental no notable dental hx. (+) Teeth Intact   Pulmonary neg pulmonary ROS,    Pulmonary exam normal breath sounds clear to auscultation       Cardiovascular Exercise Tolerance: Good negative cardio ROS Normal cardiovascular exam Rhythm:Regular Rate:Normal     Neuro/Psych PSYCHIATRIC DISORDERS Anxiety Depression negative neurological ROS  negative psych ROS   GI/Hepatic negative GI ROS, Neg liver ROS,   Endo/Other  negative endocrine ROS  Renal/GU negative Renal ROS  negative genitourinary   Musculoskeletal   Abdominal   Peds  Hematology negative hematology ROS (+) anemia ,   Anesthesia Other Findings   Reproductive/Obstetrics negative OB ROS                             Anesthesia Physical Anesthesia Plan  ASA: I  Anesthesia Plan: General ETT   Post-op Pain Management:    Induction:   PONV Risk Score and Plan: 2 and Ondansetron, Dexamethasone, Midazolam and Treatment may vary due to age or medical condition  Airway Management Planned:   Additional Equipment:   Intra-op Plan:   Post-operative Plan:   Informed Consent: I have reviewed the patients History and Physical, chart, labs and discussed the procedure including the risks, benefits and alternatives for the proposed anesthesia with the patient or authorized representative who has indicated his/her understanding and acceptance.   Dental Advisory Given  Plan Discussed with: Anesthesiologist  Anesthesia Plan Comments:         Anesthesia  Quick Evaluation

## 2017-04-07 NOTE — Discharge Instructions (Signed)
Call [facility] @ [phone] if:  You have a temperature greater than or equal to 100.4 degrees Farenheit orally You have pain that is not made better by the pain medication given and taken as directed You have excessive bleeding or problems urinating  Take Colace (Docusate Sodium/Stool Softener) 100 mg 2-3 times daily while taking narcotic pain medicine to avoid constipation or until bowel movements are regular. Take Ibuprofen 600 mg with food every 6 hours for 5 days then as needed for pain;   your first dose should be today at 6:30 p.m.  You may drive after 24 hours You may walk up steps  You may shower tomorrow You may resume a regular diet  Keep incisions clean and dry Avoid anything in vagina until after your post-operative visit    DISCHARGE INSTRUCTIONS: Laparoscopy  The following instructions have been prepared to help you care for yourself upon your return home today.  Wound care:  Do not get the incision wet for the first 24 hours. The incision should be kept clean and dry.  The Band-Aids or dressings may be removed the day after surgery.  Should the incision become sore, red, and swollen after the first week, check with your doctor.  Personal hygiene:  Shower the day after your procedure.  Activity and limitations:  Do NOT drive or operate any equipment today.  Do NOT lift anything more than 15 pounds for 2-3 weeks after surgery.  Do NOT rest in bed all day.  Walking is encouraged. Walk each day, starting slowly with 5-minute walks 3 or 4 times a day. Slowly increase the length of your walks.  Walk up and down stairs slowly.  Do NOT do strenuous activities, such as golfing, playing tennis, bowling, running, biking, weight lifting, gardening, mowing, or vacuuming for 2-4 weeks. Ask your doctor when it is okay to start.  Diet: Eat a light meal as desired this evening. You may resume your usual diet tomorrow.  Return to work: This is  dependent on the type of work you do. For the most part you can return to a desk job within a week of surgery. If you are more active at work, please discuss this with your doctor.  What to expect after your surgery: You may have a slight burning sensation when you urinate on the first day. You may have a very small amount of blood in the urine. Expect to have a small amount of vaginal discharge/light bleeding for 1-2 weeks. It is not unusual to have abdominal soreness and bruising for up to 2 weeks. You may be tired and need more rest for about 1 week. You may experience shoulder pain for 24-72 hours. Lying flat in bed may relieve it.  Call your doctor for any of the following:  Develop a fever of 100.4 or greater  Inability to urinate 6 hours after discharge from hospital  Severe pain not relieved by pain medications  Persistent of heavy bleeding at incision site  Redness or swelling around incision site after a week  Increasing nausea or vomiting  Patient Signature________________________________________ Nurse Signature_________________________________________    Post Anesthesia Home Care Instructions  Activity: Get plenty of rest for the remainder of the day. A responsible individual must stay with you for 24 hours following the procedure.  For the next 24 hours, DO NOT: -Drive a car -Advertising copywriterperate machinery -Drink alcoholic beverages -Take any medication unless instructed by your physician -Make any legal decisions or sign important papers.  Meals:  Start with liquid foods such as gelatin or soup. Progress to regular foods as tolerated. Avoid greasy, spicy, heavy foods. If nausea and/or vomiting occur, drink only clear liquids until the nausea and/or vomiting subsides. Call your physician if vomiting continues.  Special Instructions/Symptoms: Your throat may feel dry or sore from the anesthesia or the breathing tube placed in your throat during surgery. If this causes discomfort,  gargle with warm salt water. The discomfort should disappear within 24 hours.  If you had a scopolamine patch placed behind your ear for the management of post- operative nausea and/or vomiting:  1. The medication in the patch is effective for 72 hours, after which it should be removed.  Wrap patch in a tissue and discard in the trash. Wash hands thoroughly with soap and water. 2. You may remove the patch earlier than 72 hours if you experience unpleasant side effects which may include dry mouth, dizziness or visual disturbances. 3. Avoid touching the patch. Wash your hands with soap and water after contact with the patch.

## 2017-04-07 NOTE — Anesthesia Procedure Notes (Signed)
Procedure Name: Intubation Date/Time: 04/07/2017 10:41 AM Performed by: Cleda ClarksBROWDER, Tanishka Drolet R Pre-anesthesia Checklist: Patient identified, Emergency Drugs available, Suction available and Patient being monitored Patient Re-evaluated:Patient Re-evaluated prior to induction Oxygen Delivery Method: Circle system utilized Preoxygenation: Pre-oxygenation with 100% oxygen Induction Type: IV induction Ventilation: Mask ventilation without difficulty Laryngoscope Size: Miller and 2 Tube type: Oral Tube size: 7.0 mm Number of attempts: 1 Airway Equipment and Method: Stylet Placement Confirmation: ETT inserted through vocal cords under direct vision,  positive ETCO2 and breath sounds checked- equal and bilateral Secured at: 21 cm Tube secured with: Tape Dental Injury: Teeth and Oropharynx as per pre-operative assessment

## 2017-04-07 NOTE — Interval H&P Note (Signed)
History and Physical Interval Note:  04/07/2017 10:26 AM  Ruth Lewis  has presented today for surgery, with the diagnosis of RIGHT OVARIAN CYST  The various methods of treatment have been discussed with the patient and family. After consideration of risks, benefits and other options for treatment, the patient has consented to  Procedure(s): LAPAROSCOPIC OVARIAN CYSTECTOMY (Right) as a surgical intervention .  The patient's history has been reviewed, patient examined, no change in status, stable for surgery.  I have reviewed the patient's chart and labs.  Questions were answered to the patient's satisfaction.     Purcell NailsOBERTS,Teddi Badalamenti Y

## 2017-04-07 NOTE — Transfer of Care (Signed)
Immediate Anesthesia Transfer of Care Note  Patient: Ruth Lewis  Procedure(s) Performed: Procedure(s): LAPAROSCOPIC OVARIAN CYSTECTOMY (Right)  Patient Location: PACU  Anesthesia Type:General  Level of Consciousness: awake, alert  and oriented  Airway & Oxygen Therapy: Patient Spontanous Breathing and Patient connected to nasal cannula oxygen  Post-op Assessment: Report given to RN and Post -op Vital signs reviewed and stable  Post vital signs: Reviewed and stable  Last Vitals:  Vitals:   04/07/17 0912  BP: 120/81  Pulse: (!) 50  Resp: 16  Temp: 37.1 C    Last Pain:  Vitals:   04/07/17 0912  TempSrc: Oral      Patients Stated Pain Goal: 5 (04/07/17 0912)  Complications: No apparent anesthesia complications

## 2017-04-08 ENCOUNTER — Encounter (HOSPITAL_COMMUNITY): Payer: Self-pay | Admitting: Obstetrics and Gynecology

## 2017-06-18 ENCOUNTER — Ambulatory Visit: Payer: BC Managed Care – PPO | Admitting: Family Medicine

## 2018-09-05 ENCOUNTER — Other Ambulatory Visit (HOSPITAL_COMMUNITY)
Admission: RE | Admit: 2018-09-05 | Discharge: 2018-09-05 | Disposition: A | Discharge status: Home / Self Care | Payer: BC Managed Care – PPO | Source: Ambulatory Visit | Attending: Family Medicine | Admitting: Family Medicine

## 2018-09-05 ENCOUNTER — Other Ambulatory Visit: Payer: Self-pay

## 2018-09-05 ENCOUNTER — Ambulatory Visit: Payer: BC Managed Care – PPO | Admitting: Family Medicine

## 2018-09-05 ENCOUNTER — Encounter: Payer: Self-pay | Admitting: Family Medicine

## 2018-09-05 VITALS — BP 102/68 | HR 113 | Temp 98.9°F | Resp 16 | Ht 68.5 in | Wt 144.0 lb

## 2018-09-05 DIAGNOSIS — Z8709 Personal history of other diseases of the respiratory system: Secondary | ICD-10-CM

## 2018-09-05 DIAGNOSIS — F411 Generalized anxiety disorder: Secondary | ICD-10-CM | POA: Diagnosis not present

## 2018-09-05 DIAGNOSIS — M5442 Lumbago with sciatica, left side: Secondary | ICD-10-CM

## 2018-09-05 DIAGNOSIS — Z113 Encounter for screening for infections with a predominantly sexual mode of transmission: Secondary | ICD-10-CM

## 2018-09-05 DIAGNOSIS — M5441 Lumbago with sciatica, right side: Secondary | ICD-10-CM

## 2018-09-05 DIAGNOSIS — Z975 Presence of (intrauterine) contraceptive device: Secondary | ICD-10-CM

## 2018-09-05 DIAGNOSIS — G8929 Other chronic pain: Secondary | ICD-10-CM

## 2018-09-05 DIAGNOSIS — F331 Major depressive disorder, recurrent, moderate: Secondary | ICD-10-CM

## 2018-09-05 DIAGNOSIS — N926 Irregular menstruation, unspecified: Secondary | ICD-10-CM | POA: Insufficient documentation

## 2018-09-05 HISTORY — DX: Personal history of other diseases of the respiratory system: Z87.09

## 2018-09-05 HISTORY — DX: Presence of (intrauterine) contraceptive device: Z97.5

## 2018-09-05 MED ORDER — DICLOFENAC SODIUM 75 MG PO TBEC: 75.0000 mg | DELAYED_RELEASE_TABLET | Freq: Two times a day (BID) | ORAL | 0 refills | Status: DC

## 2018-09-05 MED ORDER — CYCLOBENZAPRINE HCL 10 MG PO TABS: 10.0000 mg | ORAL_TABLET | Freq: Every evening | ORAL | 0 refills | Status: DC | PRN

## 2018-09-05 NOTE — Patient Instructions (Addendum)
Please return in 4-6 weeks if not improving.  I'd like you to return in June 2020 for a complete physical.   We will call you with information regarding your referral appointment. Physical therapy.  If you do not hear from us within the next 2 weeks, please let me know. It can take 1-2 weeks to get appointments set up with the specialists.   Please go to our [facility] [place] office to get your xrays done. You can walk in M-F between 8am and 5pm. Tell them you are there for xrays ordered by me. They will send me the results, then I will let you know the results with instructions.   Address: [address], [place], [place] [phone]  (office sits at [address] at [address]; from here, turn left onto [address] to [address], turn right and go for a mile or so, office will be on left across form [address] )    It was a pleasure meeting you today! Thank you for choosing us to meet your healthcare needs! I truly look forward to working with you. If you have any questions or concerns, please send me a message via Mychart or call the office at [phone].

## 2018-09-05 NOTE — Progress Notes (Signed)
Subjective  CC:  Chief Complaint  Patient presents with  . Establish Care  . Back Pain    Pain on/off since 7-04/2018, lower back.. Burning sensation, hip popping, sharp pain at times.. Does not take medication for the pain    HPI: Ruth Lewis is a 22 y.o. female who presents to [facility] today to establish care with me as a new patient.  Former pt of Dr. [place]. I have reviewed old records.  She has the following concerns or needs:  C/o 6 months of low back pain, right > left. 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 Denies strain or radicular sxs. No b/b/ dysfunction. Now pain is constant and worse with standing. Won't take any medications. Hasn't tried any exercises. Last night had spasm while trying to sleep on her back.   Uses nexplanon for birth control. Per gyn.   H/o depression and GAD; dxd with anxiety as a child. Used lexapro most recently but stopped due to it interfering with her napping/sleep. Always feels anxious but doesn't feel it is bothering her.  GAD 7 : Generalized Anxiety Score 09/05/2018  Nervous, Anxious, on Edge 1  Control/stop worrying 2  Worry too much - different things 3  Trouble relaxing 1  Restless 0  Easily annoyed or irritable 2  Afraid - awful might happen 1  Total GAD 7 Score 10  Anxiety Difficulty Not difficult at all    Depression screen [facility] 2/9 09/05/2018 01/25/2017  Decreased Interest 0 2  Down, Depressed, Hopeless 0 3  PHQ - 2 Score 0 5  Altered sleeping 1 2  Tired, decreased energy 0 1  Change in appetite 1 0  Feeling bad or failure about yourself  0 3  Trouble concentrating 0 1  Moving slowly or fidgety/restless 0 0  Suicidal thoughts 0 0  PHQ-9 Score 2 12  Difficult doing work/chores Not difficult at all Somewhat difficult    Reports childhood asthma; but feels sob if out in cold or high altitude. Rare  albuterol use.   Monogamous x 3 years. G0P0; to graduate from [employer] in may; wants to go to grad school. No tob.   Assessment  1. Chronic bilateral low back pain with bilateral sciatica   2. Presence of subdermal contraceptive implant - Nexplanon 2019   3. GAD (generalized anxiety disorder)   4. History of asthma   5. Depression, major, recurrent, moderate (HCC) Chronic  6. Screen for STD (sexually transmitted disease)      Plan   BP: nsaids, mm relaxer and xrays. Start PT at school. Education given  GAD: monitor for now. Would recommend paxil to help with sleep if needed in the future. Or remeron.   Monitor for asthma sxs.   Screen for stds. Other HM is up to date.   Follow up:  Return in about 6 weeks (around 10/17/2018) for recheck. Orders Placed This Encounter  Procedures  . DG Lumbar Spine Complete  . Ambulatory referral to Physical Therapy   Meds ordered this encounter  Medications  . diclofenac (VOLTAREN) 75 MG EC tablet    Sig: Take 1 tablet (75 mg total) by mouth 2 (two) times daily.    Dispense:  30 tablet    Refill:  0  . cyclobenzaprine (FLEXERIL) 10 MG tablet    Sig: Take 1 tablet (10 mg total) by mouth at bedtime  as needed for muscle spasms.    Dispense:  30 tablet    Refill:  0     Dep[facility] screen [facility] 2/9 09/05/2018 01/25/2017  Decreased Interest 0 2  Down, Depressed, Hopeless 0 3  PHQ - 2 Score 0 5  Altered sleeping 1 2  Tired, decreased energy 0 1  Change in appetite 1 0  Feeling bad or failure about yourself  0 3  Trouble concentrating 0 1  Moving slowly or fidgety/restless 0 0  Suicidal thoughts 0 0  PHQ-9 Score 2 12  Difficult doing work/chores Not difficult at all Somewhat difficult    We updated and reviewed the patient's past history in detail and it is documented below.  Patient Active Problem List   Diagnosis Date Noted  . Prolonged periods 09/05/2018  . Presence of subdermal contraceptive implant - Nexplanon 2019 09/05/2018  .  GAD (generalized anxiety disorder) 09/05/2018    dxd by peds; Dr. Caroline Morewissleton   . History of asthma 09/05/2018    dxd in childhood, but cold and high altitude still bother her   . Depression, major, recurrent, moderate (HCC) 01/25/2017   Health Maintenance  Topic Date Due  . CHLAMYDIA SCREENING  09/15/2011  . HIV Screening  09/15/2011  . INFLUENZA VACCINE  12/06/2018 (Originally 04/07/2018)  . PAP-Cervical Cytology Screening  03/07/2021  . PAP SMEAR-Modifier  03/07/2021  . TETANUS/TDAP  04/05/2025    There is no immunization history on file for this patient. Current Meds  Medication Sig  . albuterol (PROVENTIL HFA;VENTOLIN HFA) 108 (90 Base) MCG/ACT inhaler Inhale 2 puffs into the lungs every 6 (six) hours as needed for wheezing or shortness of breath.  . etonogestrel (NEXPLANON) 68 MG IMPL implant 1 each by Subdermal route once.  . [DISCONTINUED] ibuprofen (ADVIL,MOTRIN) 600 MG tablet 1 po  pc   every 6 hours for 5 days then as needed for pain    Allergies: Patient has No Known Allergies. Past Medical History Patient  has a past medical history of Anemia, Anxiety, Depression, History of asthma (09/05/2018), History of frequent urinary tract infections, Ovarian cyst, and Presence of subdermal contraceptive implant - Nexplanon 2019 (09/05/2018). Past Surgical History Patient  has a past surgical history that includes widom teeth ext; Hernia repair; and Laparoscopic ovarian cystectomy (Right, 04/07/2017). Family History: Patient family history includes Cancer in her maternal grandfather; Diabetes in her father. Social History:  Patient  reports that she has never smoked. She has never used smokeless tobacco. She reports that she does not drink alcohol or use drugs.  Review of Systems: Constitutional: negative for fever or malaise Ophthalmic: negative for photophobia, double vision or loss of vision Cardiovascular: negative for chest pain, dyspnea on exertion, or new LE  swelling Respiratory: negative for SOB or persistent cough Gastrointestinal: negative for abdominal pain, change in bowel habits or melena Genitourinary: negative for dysuria or gross hematuria Musculoskeletal: negative for new gait disturbance or muscular weakness Integumentary: negative for new or persistent rashes Neurological: negative for TIA or stroke symptoms Psychiatric: negative for SI or delusions Allergic/Immunologic: negative for hives  Patient Care Team    Relationship Specialty Notifications Start End  Willow OraAndy, Shavontae Gibeault L, MD PCP - General Family Medicine  09/05/18   Osborn Cohooberts, Angela, MD Consulting Physician Obstetrics and Gynecology  09/05/18     Objective  Vitals: BP 102/68   Pulse (!) 113   Temp 98.9 F (37.2 C) (Oral)   Resp 16   Ht 5' 8.5" (1.74 m)  Wt 144 lb (65.3 kg)   LMP 08/19/2018 (Within Weeks)   SpO2 98%   BMI 21.58 kg/m  General:  Well developed, well nourished, no acute distress  Psych:  Alert and oriented,normal mood and affect HEENT:  Normocephalic, atraumatic, non-icteric sclera, PERRL, oropharynx is without mass or exudate, supple neck without adenopathy, mass or thyromegaly Cardiovascular:  RRR without gallop, rub or murmur, nondisplaced PMI Respiratory:  Good breath sounds bilaterally, CTAB with normal respiratory effort Gastrointestinal: normal bowel sounds, soft, non-tender, no noted masses. No HSM MSK: no deformities, contusions. Joints are without erythema or swelling Skin:  Warm, no rashes or suspicious lesions noted Neurologic:    Mental status is normal. Gross motor and sensory exams are normal. Normal gait. Neg slr bilaterally with normal reflexes Back: right lumbar spasm and ttp with tender knots and ttp right SI joint. FROM with pain with right lateral flexion and extension.    Commons side effects, risks, benefits, and alternatives for medications and treatment plan prescribed today were discussed, and the patient expressed  understanding of the given instructions. Patient is instructed to call or message via MyChart if he/she has any questions or concerns regarding our treatment plan. No barriers to understanding were identified. We discussed Red Flag symptoms and signs in detail. Patient expressed understanding regarding what to do in case of urgent or emergency type symptoms.   Medication list was reconciled, printed and provided to the patient in AVS. Patient instructions and summary information was reviewed with the patient as documented in the AVS. This note was prepared with assistance of Dragon voice recognition software. Occasional wrong-word or sound-a-like substitutions may have occurred due to the inherent limitations of voice recognition software

## 2018-09-06 ENCOUNTER — Ambulatory Visit (INDEPENDENT_AMBULATORY_CARE_PROVIDER_SITE_OTHER): Payer: BC Managed Care – PPO

## 2018-09-06 DIAGNOSIS — G8929 Other chronic pain: Secondary | ICD-10-CM

## 2018-09-06 DIAGNOSIS — M5442 Lumbago with sciatica, left side: Secondary | ICD-10-CM

## 2018-09-06 DIAGNOSIS — M5441 Lumbago with sciatica, right side: Secondary | ICD-10-CM

## 2018-09-08 NOTE — Progress Notes (Signed)
Please call patient: I have reviewed his/her xray results. Back xrays are normal.

## 2018-09-09 LAB — URINE CYTOLOGY ANCILLARY ONLY
Chlamydia: NEGATIVE
Neisseria Gonorrhea: NEGATIVE

## 2018-09-10 NOTE — Progress Notes (Signed)
Please call patient: I have reviewed his/her lab results. Std routine screens are negative.

## 2018-10-27 ENCOUNTER — Other Ambulatory Visit: Payer: Self-pay | Admitting: *Deleted

## 2018-10-27 DIAGNOSIS — M545 Low back pain, unspecified: Secondary | ICD-10-CM

## 2018-10-29 ENCOUNTER — Ambulatory Visit
Admission: RE | Admit: 2018-10-29 | Discharge: 2018-10-29 | Disposition: A | Discharge status: Home / Self Care | Payer: BC Managed Care – PPO | Source: Ambulatory Visit | Attending: *Deleted | Admitting: *Deleted

## 2018-10-29 DIAGNOSIS — M545 Low back pain, unspecified: Secondary | ICD-10-CM

## 2018-11-14 ENCOUNTER — Ambulatory Visit: Payer: BC Managed Care – PPO | Admitting: Family Medicine

## 2018-11-14 ENCOUNTER — Encounter: Payer: Self-pay | Admitting: Family Medicine

## 2018-11-14 ENCOUNTER — Other Ambulatory Visit: Payer: Self-pay

## 2018-11-14 VITALS — BP 98/62 | HR 103 | Temp 98.6°F | Resp 16 | Ht 68.5 in | Wt 152.4 lb

## 2018-11-14 DIAGNOSIS — F39 Unspecified mood [affective] disorder: Secondary | ICD-10-CM

## 2018-11-14 NOTE — Patient Instructions (Signed)
Please set up an appointment with a psychiatrist in [place] to help clarify your mood disorder.   Start taking melatonin to help your sleep.   Suicidal Feelings: How to Help Yourself Suicide is when you end your own life. There are many things you can do to help yourself feel better when struggling with these feelings. Many services and people are available to support you and others who struggle with similar feelings. If you ever feel like you may hurt yourself or others, or have thoughts about taking your own life, get help right away. To get help:  Call your local emergency services (911 in the [place]  Go to your nearest emergency department.  Call a suicide hotline to speak with a trained counselor. The following suicide hotlines are available in the [place]: ? 1-800-273-TALK (980)203-3910). ? 1-800-SUICIDE 440-303-9942). ? 740-824-7361. This is a hotline for Spanish speakers. ? 234-099-3654. This is a hotline for TTY users. ? 1-866-4-U-TREVOR (813) 029-6464). This is a hotline for lesbian, gay, bisexual, transgender, or questioning youth. ? For a list of hotlines in [place], visit ItCheaper.dk.html  Contact a crisis center or a local suicide prevention center. To find a crisis center or suicide prevention center: ? Call your local hospital, clinic, community service organization, mental health center, social service provider, or health department. Ask for help with connecting to a crisis center. ? For a list of crisis centers in the [place], visit: suicidepreventionlifeline.org ? For a list of crisis centers in [place], visit: suicideprevention.ca How to help yourself feel better   Promise yourself that you will not do anything extreme when you have suicidal feelings. Remember, there is [place]. Many people have gotten through suicidal thoughts and feelings, and you can too. If you have had these feelings before, remind  yourself that you can get through them again.  Let family, friends, teachers, or counselors know how you are feeling. Try not to separate yourself from those who care about you and want to help you. Talk with someone every day, even if you do not feel sociable. Face-to-face conversation is best to help them understand your feelings.  Contact a mental health care provider and work with this person regularly.  Make a safety plan that you can follow during a crisis. Include phone numbers of suicide prevention hotlines, mental health professionals, and trusted friends and family members you can call during an emergency. Save these numbers on your phone.  If you are thinking of taking a lot of medicine, give your medicine to someone who can give it to you as prescribed. If you are on antidepressants and are concerned you will overdose, tell your health care provider so that he or she can give you safer medicines.  Try to stick to your routines. Follow a schedule every day. Make self-care a priority.  Make a list of realistic goals, and cross them off when you achieve them. Accomplishments can give you a sense of worth.  Wait until you are feeling better before doing things that you find difficult or unpleasant.  Do things that you have always enjoyed to take your mind off your feelings. Try reading a book, or listening to or playing music. Spending time outside, in nature, may help you feel better. Follow these instructions at home:   Visit your primary health care provider every year for a checkup.  Work with a mental health care provider as needed.  Eat a well-balanced diet, and eat regular meals.  Get plenty of rest.  Exercise if you are able. Just 30 minutes of exercise each day can help you feel better.  Take over-the-counter and prescription medicines only as told by your health care provider. Ask your mental health care provider about the possible side effects of any medicines you are  taking.  Do not use alcohol or drugs, and remove these substances from your home.  Remove weapons, poisons, knives, and other deadly items from your home. General recommendations  Keep your living space well lit.  When you are feeling well, write yourself a letter with tips and support that you can read when you are not feeling well.  Remember that life's difficulties can be sorted out with help. Conditions can be treated, and you can learn behaviors and ways of thinking that will help you. Where to find more information  National Suicide Prevention Lifeline: www.suicidepreventionlifeline.org  Hopeline: www.hopeline.com  [employer] for Suicide Prevention: https://www.ayers.com/  [employer] (for lesbian, gay, bisexual, transgender, or questioning youth): www.thetrevorproject.org Contact a health care provider if:  You feel as though you are a burden to others.  You feel agitated, angry, vengeful, or have extreme mood swings.  You have withdrawn from family and friends. Get help right away if:  You are talking about suicide or wishing to die.  You start making plans for how to commit suicide.  You feel that you have no reason to live.  You start making plans for putting your affairs in order, saying goodbye, or giving your possessions away.  You feel guilt, shame, or unbearable pain, and it seems like there is no way out.  You are frequently using drugs or alcohol.  You are engaging in risky behaviors that could lead to death. If you have any of these symptoms, get help right away. Call emergency services, go to your nearest emergency department or crisis center, or call a suicide crisis helpline. Summary  Suicide is when you take your own life.  Promise yourself that you will not do anything extreme when you have suicidal feelings.  Let family, friends, teachers, or counselors know how you are feeling.  Get help right away if you feel as though life is  getting too tough to handle and you are thinking about suicide. This information is not intended to replace advice given to you by your health care provider. Make sure you discuss any questions you have with your health care provider. Document Released: 02/28/2003 Document Revised: 04/06/2017 Document Reviewed: 04/06/2017 Elsevier Interactive Patient Education  2019 [employer].

## 2018-11-14 NOTE — Progress Notes (Signed)
Subjective  CC:  Chief Complaint  Patient presents with  . Anxiety    Not sleeping or eating well.[name] GAD score 21, PHQ-9    HPI: Ruth Lewis is a 23 y.o. female who presents to the office today to address the problems listed above in the chief complaint, mood problems.  23 year old returns due to worsening mood symptoms.  She reports that she is feeling highly anxious most days of the week.  Also feeling very emotional, crying in class.  Not sleeping well.  Admits to going several days without sleep.  Denies hallucinations.  Highly irritable. She denies current suicidal or homicidal plan or intent.   Has struggled with mood since high school.  Had been treated with SSRIs in the past but had side effects.  No family history of bipolar disorder. Depression screen [facility] 2/9 11/14/2018 09/05/2018 01/25/2017  Decreased Interest 2 0 2  Down, Depressed, Hopeless 2 0 3  PHQ - 2 Score 4 0 5  Altered sleeping 3 1 2   Tired, decreased energy 2 0 1  Change in appetite 3 1 0  Feeling bad or failure about yourself  2 0 3  Trouble concentrating 3 0 1  Moving slowly or fidgety/restless 1 0 0  Suicidal thoughts 1 0 0  PHQ-9 Score 19 2 12   Difficult doing work/chores Very difficult Not difficult at all Somewhat difficult   GAD 7 : Generalized Anxiety Score 11/14/2018 09/05/2018  Nervous, Anxious, on Edge 3 1  Control/stop worrying 3 2  Worry too much - different things 3 3  Trouble relaxing 3 1  Restless 3 0  Easily annoyed or irritable 3 2  Afraid - awful might happen 3 1  Total GAD 7 Score 21 10  Anxiety Difficulty Very difficult Not difficult at all    Positive mood disorder questionnaire today.  See scann   Assessment  1. Mood disorder (HCC)      Plan   Worrisome for bipolar disorder, active.  Recommend psychiatric referral and further evaluations for proper treatment.    Reviewed concept of mood problems caused by biochemical imbalance of neurotransmitters and rationale for  treatment with medications and therapy.   Counseling given: pt was instructed to contact office, on-call physician or crisis Hotline if symptoms worsen significantly. If patient develops any suicidal or homicidal thoughts, she is directed to the ER immediately.   See after visit summary for emergency instructions.  Patient stable.  She is not suicidal.  Follow up: As needed after psychiatry. No orders of the defined types were placed in this encounter.  No orders of the defined types were placed in this encounter.     I reviewed the patients updated PMH, FH, and SocHx.    Patient Active Problem List   Diagnosis Date Noted  . Prolonged periods 09/05/2018  . Presence of subdermal contraceptive implant - Nexplanon 2019 09/05/2018  . GAD (generalized anxiety disorder) 09/05/2018  . History of asthma 09/05/2018  . Depression, major, recurrent, moderate (HCC) 01/25/2017   Current Meds  Medication Sig  . albuterol (PROVENTIL HFA;VENTOLIN HFA) 108 (90 Base) MCG/ACT inhaler Inhale 2 puffs into the lungs every 6 (six) hours as needed for wheezing or shortness of breath.  . cyclobenzaprine (FLEXERIL) 10 MG tablet Take 1 tablet (10 mg total) by mouth at bedtime as needed for muscle spasms.  . diclofenac (VOLTAREN) 75 MG EC tablet Take 1 tablet (75 mg total) by mouth 2 (two) times daily.  [name] etonogestrel (NEXPLANON)  68 MG IMPL implant 1 each by Subdermal route once.    Allergies: Patient has No Known Allergies. Family history:  Patient family history includes Cancer in her maternal grandfather; Diabetes in her father. Social History   Socioeconomic History  . Marital status: Single    Spouse name: Not on file  . Number of children: 0  . Years of education: Not on file  . Highest education level: Not on file  Occupational History  . Occupation: Programmer, systems, psychology    Comment: to graduate 01/2019  Social Needs  . Financial resource strain: Not on file  . Food insecurity:     Worry: Not on file    Inability: Not on file  . Transportation needs:    Medical: Not on file    Non-medical: Not on file  Tobacco Use  . Smoking status: Never Smoker  . Smokeless tobacco: Never Used  Substance and Sexual Activity  . Alcohol use: No  . Drug use: No  . Sexual activity: Yes    Birth control/protection: Condom, Implant  Lifestyle  . Physical activity:    Days per week: Not on file    Minutes per session: Not on file  . Stress: Not on file  Relationships  . Social connections:    Talks on phone: Not on file    Gets together: Not on file    Attends religious service: Not on file    Active member of club or organization: Not on file    Attends meetings of clubs or organizations: Not on file    Relationship status: Not on file  Other Topics Concern  . Not on file  Social History Narrative   Originally from [place]; to graduate from [employer]; wants to go to grad school for [employer].    In a relationship x 3 years.    No tobacco;      Review of Systems: Constitutional: Negative for fever malaise or anorexia Cardiovascular: negative for chest pain Respiratory: negative for SOB or persistent cough Gastrointestinal: negative for abdominal pain  Objective  Vitals: BP 98/62   Pulse (!) 103   Temp 98.6 F (37 C) (Oral)   Resp 16   Ht 5' 8.5" (1.74 m)   Wt 152 lb 6.4 oz (69.1 kg)   SpO2 98%   BMI 22.84 kg/m  General: no acute distress, well appearing, no apparent distress, well groomed Psych:  Alert and oriented x 3,nonfocal and Coherent speech.  Cardiovascular:  RRR without murmur or gallop. no peripheral edema Respiratory:  Good breath sounds bilaterally, CTAB with normal respiratory effort Skin:  Warm, no rashes    Commons side effects, risks, benefits, and alternatives for medications and treatment plan prescribed today were discussed, and the patient expressed understanding of the given instructions. Patient is instructed to call or message via  MyChart if he/she has any questions or concerns regarding our treatment plan. No barriers to understanding were identified. We discussed Red Flag symptoms and signs in detail. Patient expressed understanding regarding what to do in case of urgent or emergency type symptoms.   Medication list was reconciled, printed and provided to the patient in AVS. Patient instructions and summary information was reviewed with the patient as documented in the AVS. This note was prepared with assistance of Dragon voice recognition software. Occasional wrong-word or sound-a-like substitutions may have occurred due to the inherent limitations of voice recognition software

## 2019-01-26 ENCOUNTER — Ambulatory Visit (INDEPENDENT_AMBULATORY_CARE_PROVIDER_SITE_OTHER): Payer: BC Managed Care – PPO | Admitting: Family Medicine

## 2019-01-26 ENCOUNTER — Encounter: Payer: Self-pay | Admitting: Family Medicine

## 2019-01-26 ENCOUNTER — Other Ambulatory Visit: Payer: Self-pay

## 2019-01-26 VITALS — HR 68 | Wt 161.0 lb

## 2019-01-26 DIAGNOSIS — R140 Abdominal distension (gaseous): Secondary | ICD-10-CM | POA: Diagnosis not present

## 2019-01-26 DIAGNOSIS — F39 Unspecified mood [affective] disorder: Secondary | ICD-10-CM | POA: Diagnosis not present

## 2019-01-26 DIAGNOSIS — K219 Gastro-esophageal reflux disease without esophagitis: Secondary | ICD-10-CM

## 2019-01-26 MED ORDER — OMEPRAZOLE 20 MG PO CPDR: 20.0000 mg | DELAYED_RELEASE_CAPSULE | Freq: Every day | ORAL | 3 refills | Status: DC

## 2019-01-26 NOTE — Progress Notes (Signed)
Virtual Visit via Video Note  Subjective  CC:  Chief Complaint  Patient presents with  . Gastroesophageal Reflux    Symptoms having been on going... Started with burping, now she has nausea, burning sensation and  bloating .[name]. She has tried Zantac/OTC acid reflux medication with minimal relief she has also tried not eating dairy with no relief  . Psychiatrist    She has not made an appointment yet, reports that she had an episode of not able to eat and mouth sores     I connected with Ruth Lewis on 01/26/19 at  3:00 PM EDT by a video enabled telemedicine application and verified that I am speaking with the correct person using two identifiers. Location patient: Home Location provider: [facility] [address], Office Persons participating in the virtual visit: Ruth Lewis, Ruth Oraamille L Juvencio Verdi, MD Rita Oharaiara Simmons, CMA  I discussed the limitations of evaluation and management by telemedicine and the availability of in person appointments. The patient expressed understanding and agreed to proceed. HPI: Ruth Lewis is a 23 y.o. female who was contacted today to address the problems listed above in the chief complaint. . As above, ongoing on and off for a year. Some nonpainful bloating relieved with belching. Some midepigastric pain postprandial w/o n/v/ or melena. No f/c. Rarely takes meds but antacids do help at times.  . Mood: doing some better. Became very busy with online school and now trying to find work; so hasn't yet sought out a psychiatry appt.  Assessment  1. Gastroesophageal reflux disease, esophagitis presence not specified   2. Mood disorder (HCC)   3. Bloating      Plan   GERD sxs:  Start PPI x 4-12 weeks. F/u with me in office if not improving. rec gasX for bloating sxs. No red flag sxs. Improve diet. Stop nsaids. Use tylenol for back pain.  Mood: rec psych. Pt to make an appt. I discussed the assessment and treatment plan with the  patient. The patient was provided an opportunity to ask questions and all were answered. The patient agreed with the plan and demonstrated an understanding of the instructions.   The patient was advised to call back or seek an in-person evaluation if the symptoms worsen or if the condition fails to improve as anticipated. Follow up: prn  Visit date not found  Meds ordered this encounter  Medications  . omeprazole (PRILOSEC) 20 MG capsule    Sig: Take 1 capsule (20 mg total) by mouth daily.    Dispense:  30 capsule    Refill:  3      I reviewed the patients updated PMH, FH, and SocHx.    Patient Active Problem List   Diagnosis Date Noted  . Prolonged periods 09/05/2018  . Presence of subdermal contraceptive implant - Nexplanon 2019 09/05/2018  . GAD (generalized anxiety disorder) 09/05/2018  . History of asthma 09/05/2018  . Depression, major, recurrent, moderate (HCC) 01/25/2017   Current Meds  Medication Sig  . albuterol (PROVENTIL HFA;VENTOLIN HFA) 108 (90 Base) MCG/ACT inhaler Inhale 2 puffs into the lungs every 6 (six) hours as needed for wheezing or shortness of breath.  . etonogestrel (NEXPLANON) 68 MG IMPL implant 1 each by Subdermal route once.    Allergies: Patient has No Known Allergies. Family History: Patient family history includes Cancer in her maternal grandfather; Diabetes in her father. Social History:  Patient  reports that she has never smoked. She has  never used smokeless tobacco. She reports that she does not drink alcohol or use drugs.  Review of Systems: Constitutional: Negative for fever malaise or anorexia Cardiovascular: negative for chest pain Respiratory: negative for SOB or persistent cough Gastrointestinal: negative for abdominal pain  OBJECTIVE Vitals: Pulse 68   Wt 161 lb (73 kg)   BMI 24.12 kg/m  General: no acute distress , A&Ox3, appears well  Ruth Ora, MD

## 2019-05-20 ENCOUNTER — Other Ambulatory Visit: Payer: Self-pay | Admitting: Family Medicine

## 2019-06-01 IMAGING — DX DG LUMBAR SPINE COMPLETE 4+V
5 series · 5 of 5 positions shown · non-contrast
Comparison: Lumbar MRI January 22, 2013

CLINICAL DATA: Chronic lumbago

EXAM:
LUMBAR SPINE - COMPLETE 4+ VIEW

[lumbar spine ap]
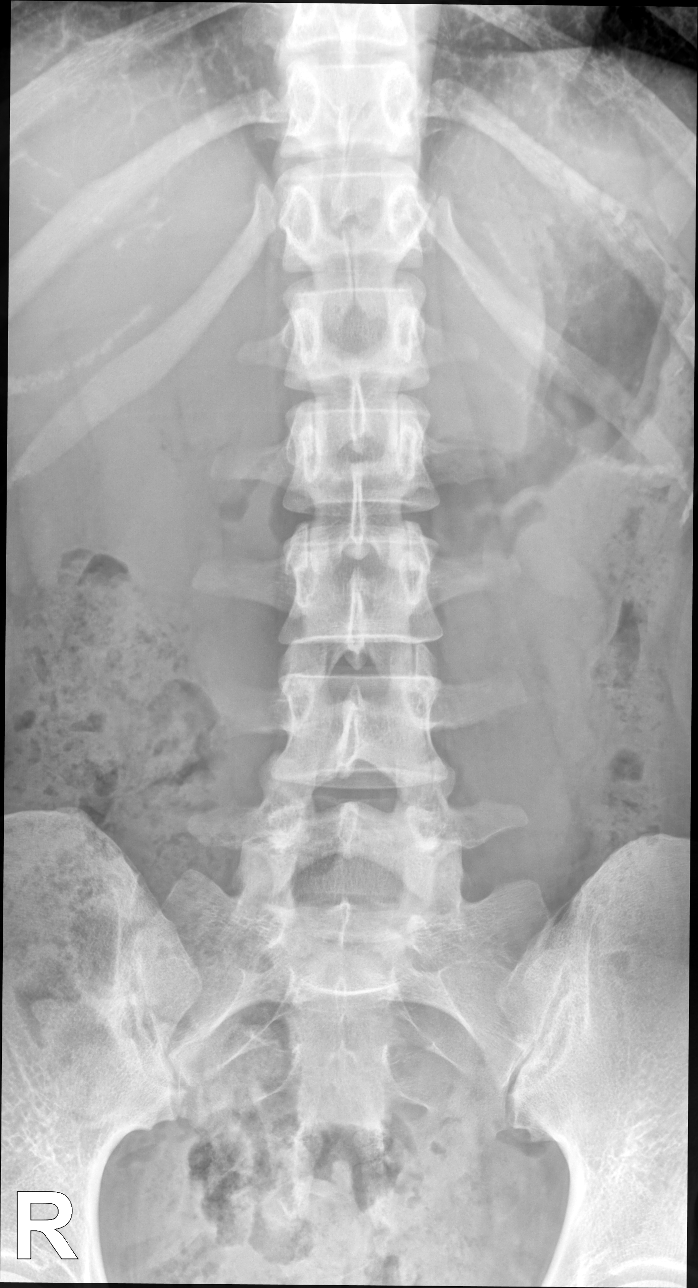

[lumbar spine oblique (1 of 2)]
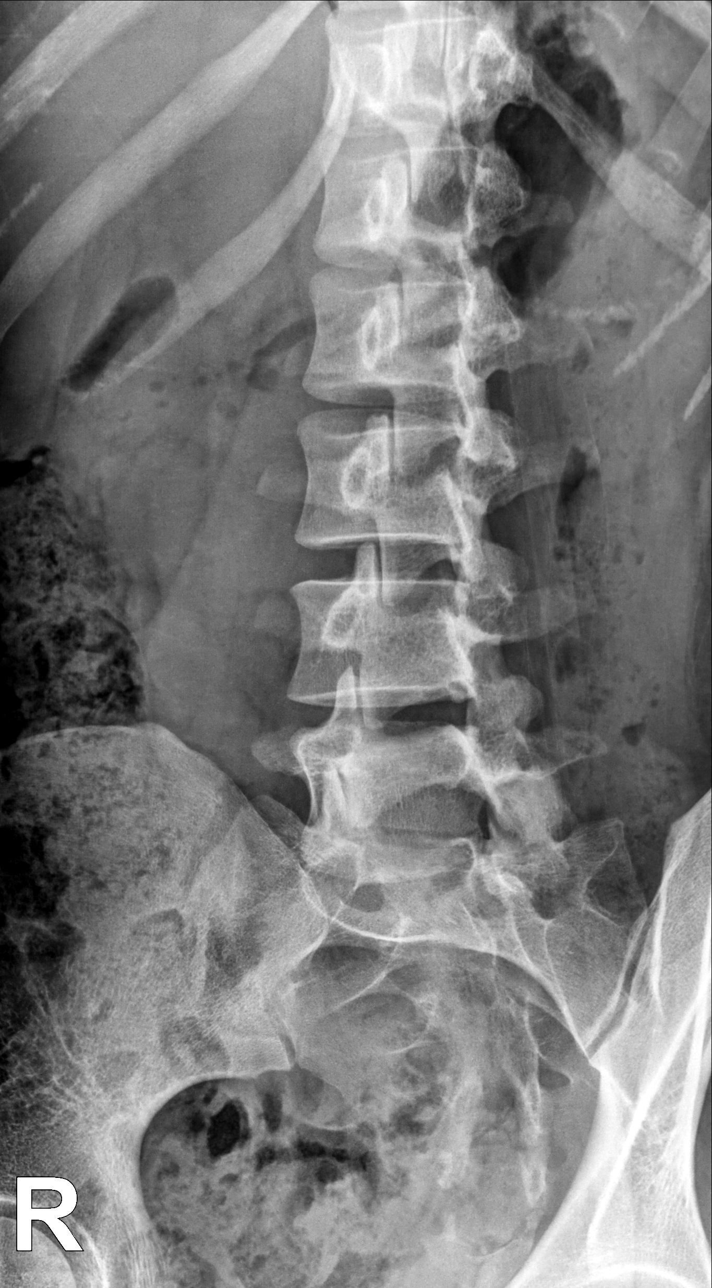

[lumbar spine oblique (2 of 2)]
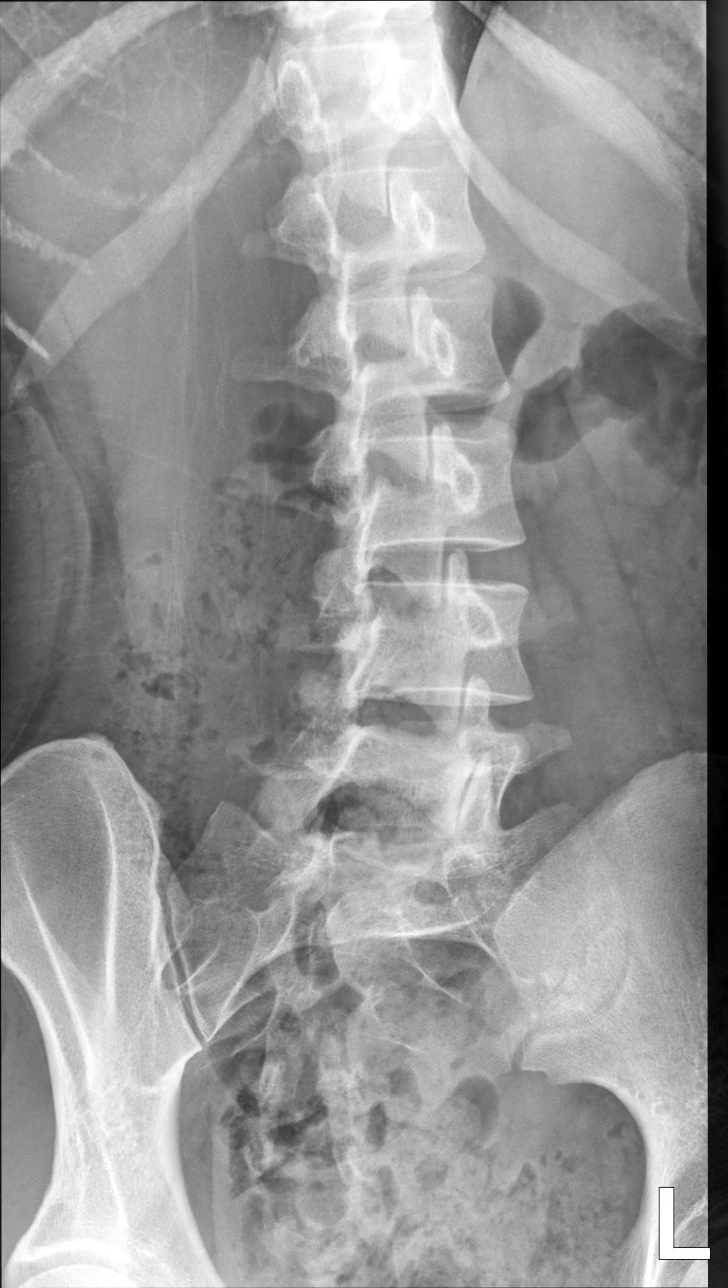

[lumbar spine lat (1 of 2)]
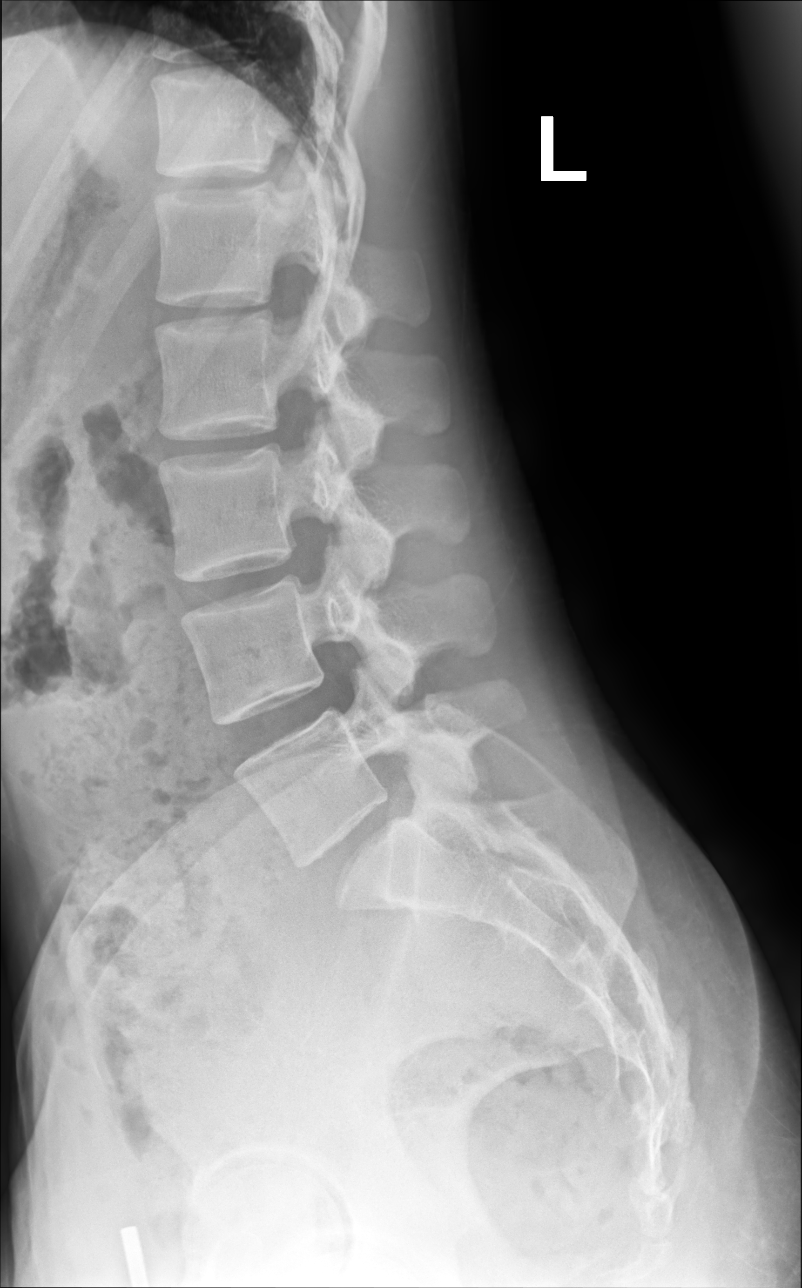

[lumbar spine lat (2 of 2)]
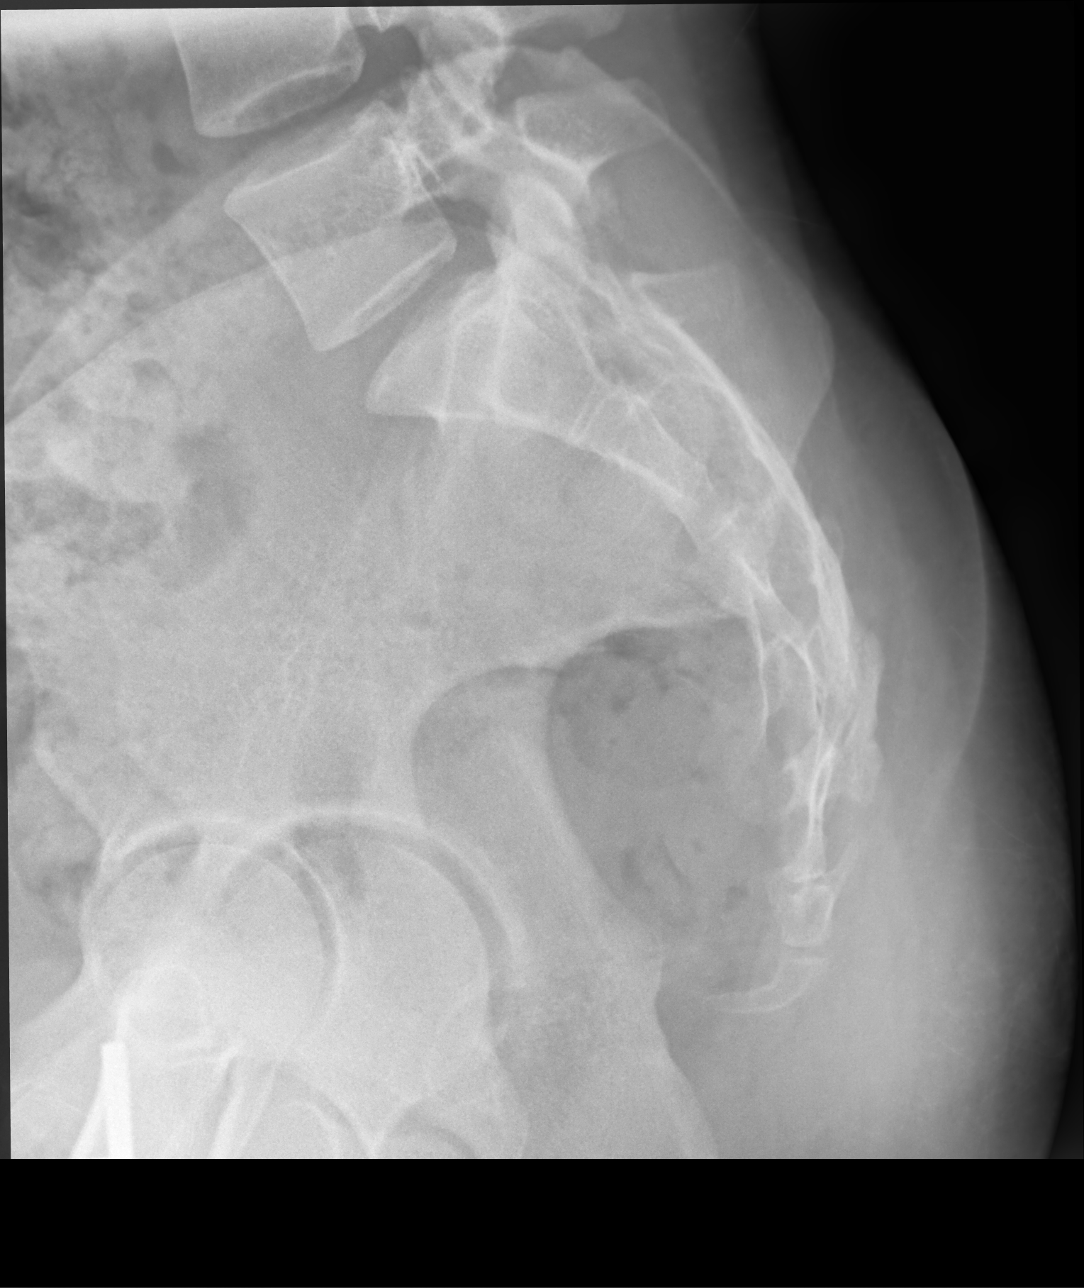

[5 of 5 positions shown; findings below may reference images not displayed]

FINDINGS: Frontal, lateral, spot lumbosacral lateral, and bilateral oblique
views were obtained. There are 5 non-rib-bearing lumbar type
vertebral bodies. There is no fracture or spondylolisthesis. Disc
spaces appear unremarkable. There is no appreciable facet
arthropathy.
IMPRESSION: No fracture or spondylolisthesis.  No evident arthropathy.

## 2019-07-24 IMAGING — MR MR LUMBAR SPINE W/O CM
5 series · 47 of 48 positions shown · non-contrast
Comparison: 01/22/2013 lumbar spine MRI. 09/06/2018 lumbar spine
radiographs.

CLINICAL DATA: 23 y/o F; lower back pain radiating to the buttocks.
Weakness in the lower extremities.

EXAM:
MRI LUMBAR SPINE WITHOUT CONTRAST
TECHNIQUE: Multiplanar, multisequence MR imaging of the lumbar spine was
performed. No intravenous contrast was administered.

[Series 3: T2 post-contrast · sagittal · 4.0mm · 0.88mm/px · 6 of 12 slices shown]
[im 1/12]
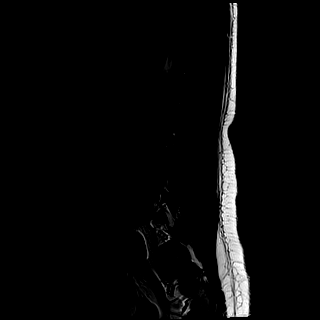
[im 3/12]
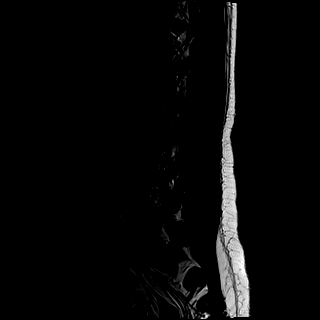
[im 5/12]
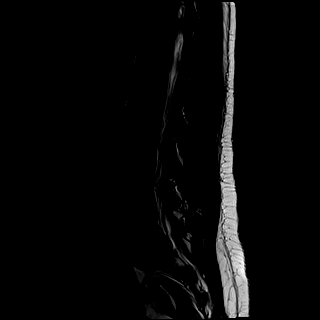
[im 7/12]
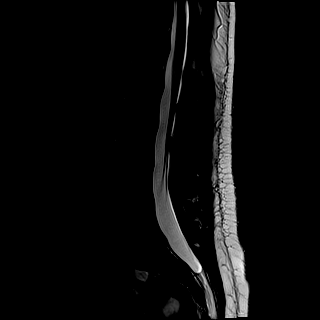
[im 9/12]
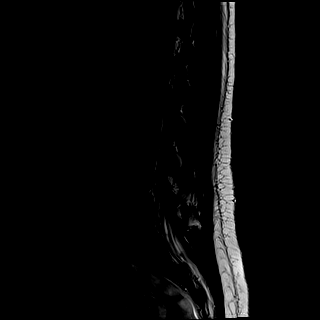
[im 12/12]
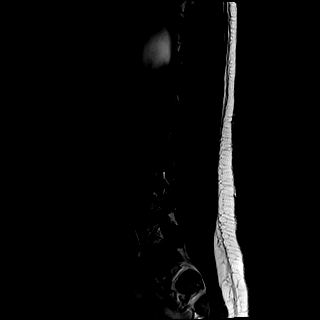

[Series 4: T1 · sagittal · 4.0mm · 0.88mm/px · 5 of 12 slices shown (1 of 2)]
[im 1/12]
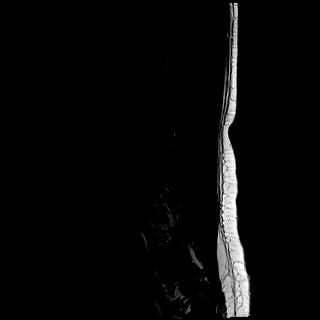
[im 3/12]
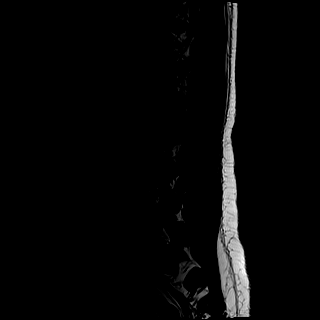
[im 6/12]
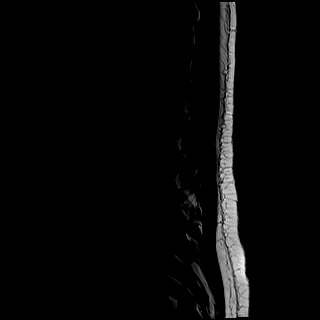
[im 9/12]
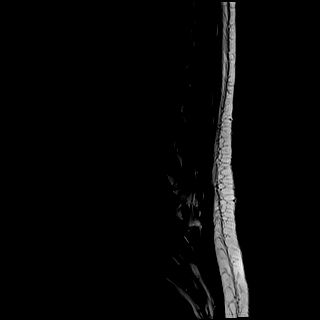
[im 12/12]
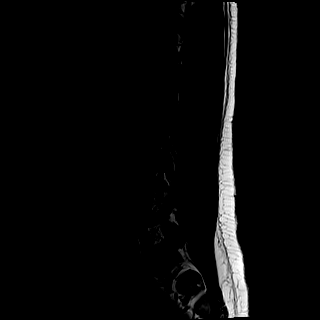

[Series 5: tirm sag · sagittal · 4.0mm · 0.55mm/px · 5 of 12 slices shown]
[im 1/12]
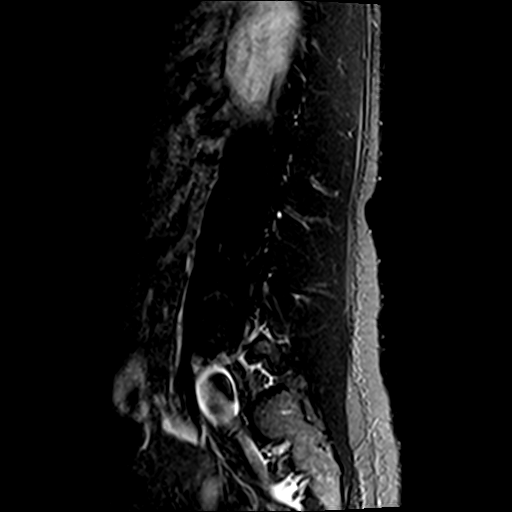
[im 3/12]
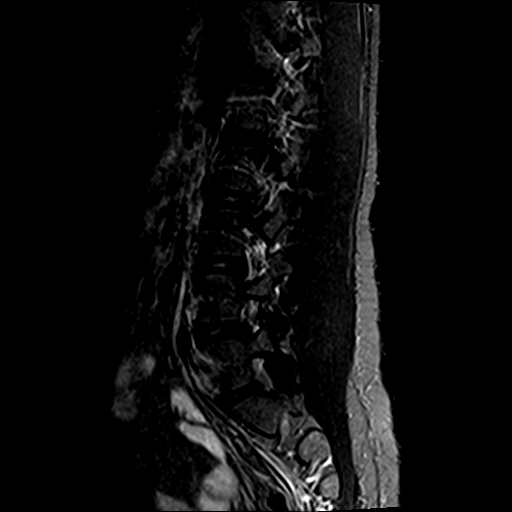
[im 6/12]
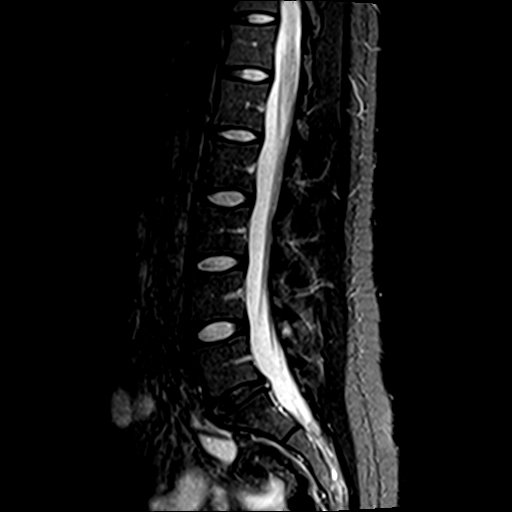
[im 9/12]
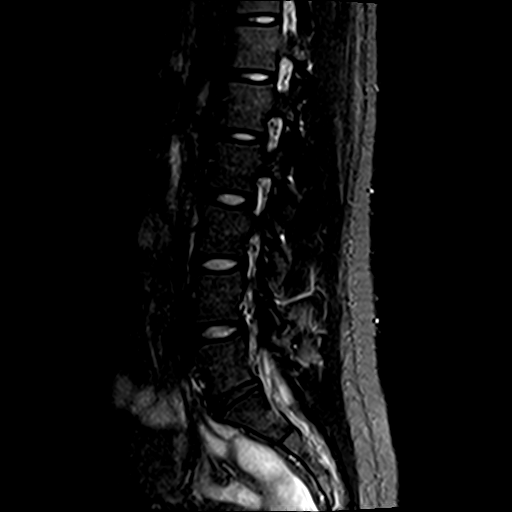
[im 12/12]
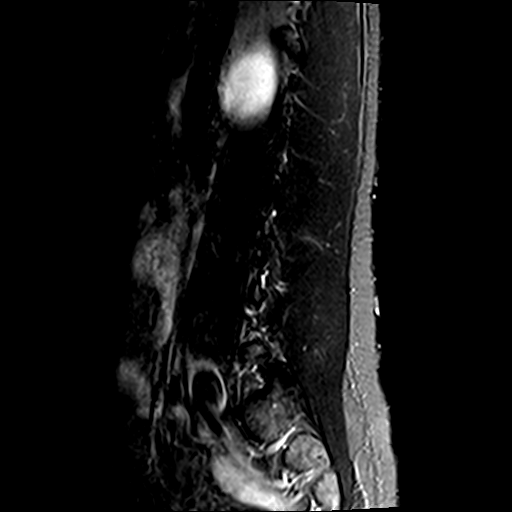

[Series 6: T1 · axial · 4.0mm · 0.78mm/px · z∈[-32,+191]mm · 15 of 36 slices shown (2 of 2)]
[im 1/36]
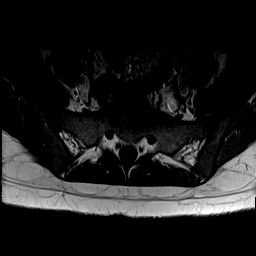
[im 3/36]
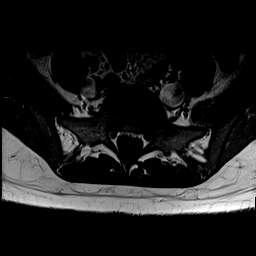
[im 5/36]
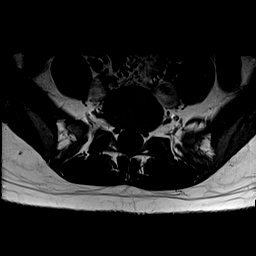
[im 8/36]
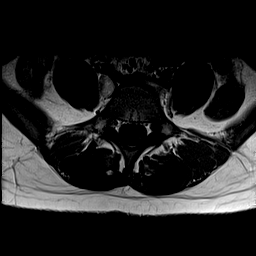
[im 10/36]
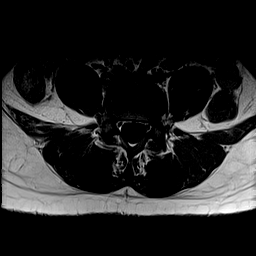
[im 12/36]
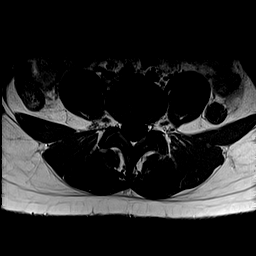
[im 15/36]
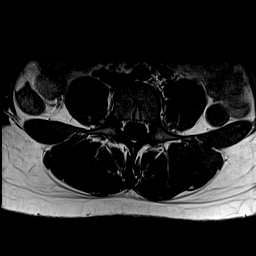
[im 17/36]
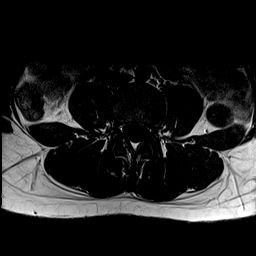
[im 19/36]
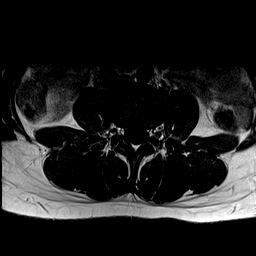
[im 22/36]
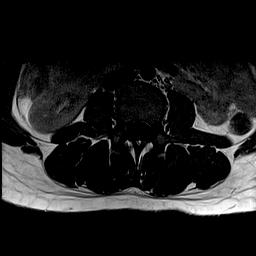
[im 24/36]
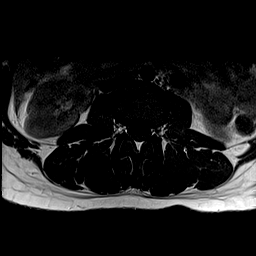
[im 26/36]
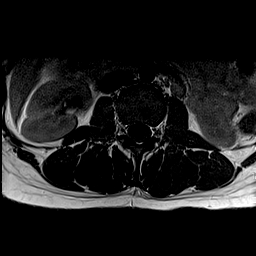
[im 29/36]
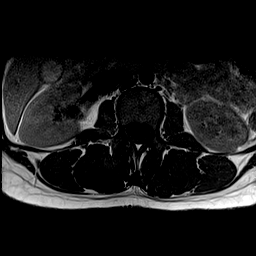
[im 31/36]
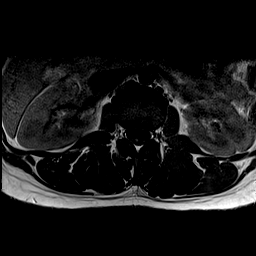
[im 36/36]
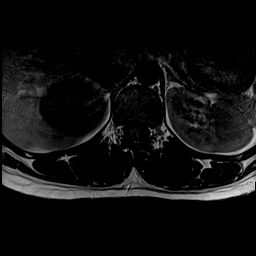

[Series 7: T2 · axial · 4.0mm · 0.78mm/px · z∈[-32,+191]mm · 16 of 36 slices shown]
[im 1/36]
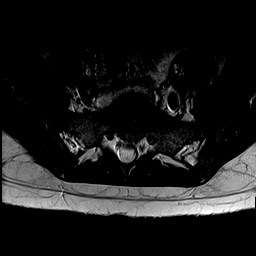
[im 3/36]
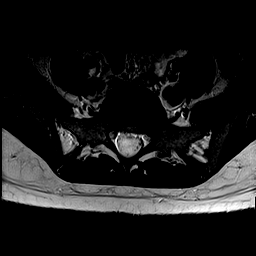
[im 5/36]
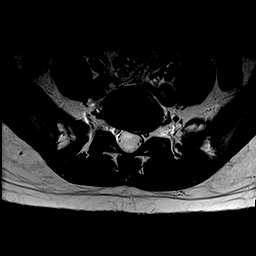
[im 8/36]
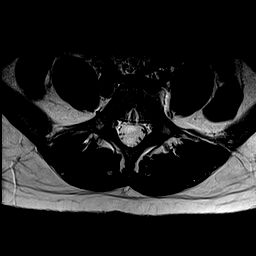
[im 10/36]
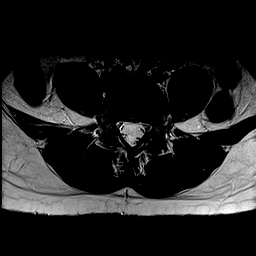
[im 12/36]
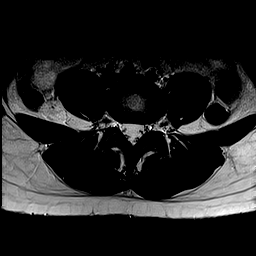
[im 15/36]
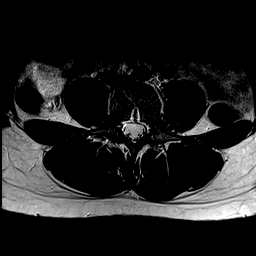
[im 17/36]
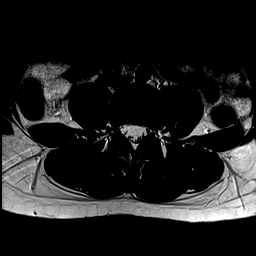
[im 19/36]
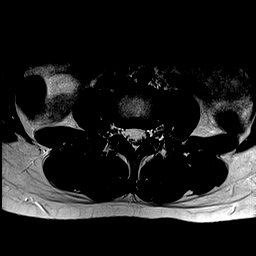
[im 22/36]
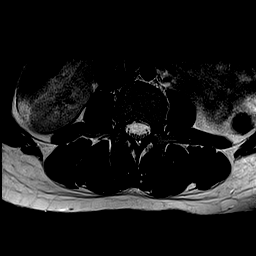
[im 24/36]
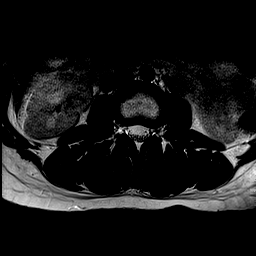
[im 26/36]
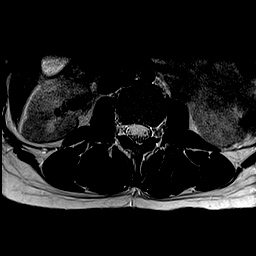
[im 29/36]
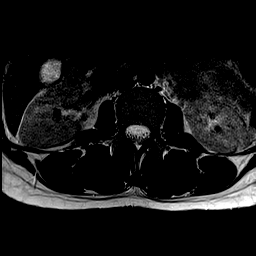
[im 31/36]
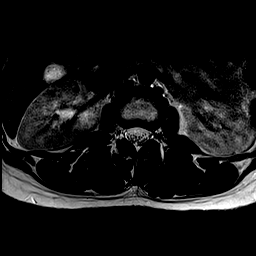
[im 33/36]
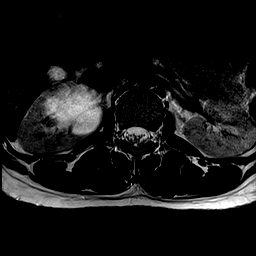
[im 36/36]
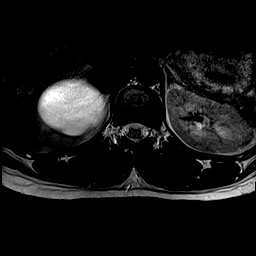

[47 of 48 positions shown; findings below may reference images not displayed]

FINDINGS: Segmentation:  Standard.

Alignment:  Physiologic.

Vertebrae:  No fracture, evidence of discitis, or bone lesion.

Conus medullaris and cauda equina: Conus extends to the L1 level.
Conus and cauda equina appear normal.

Paraspinal and other soft tissues: Negative.

Disc levels:

L1-2: No significant disc displacement, foraminal stenosis, or canal
stenosis.

L2-3: No significant disc displacement, foraminal stenosis, or canal
stenosis.

L3-4: No significant disc displacement, foraminal stenosis, or canal
stenosis.

L4-5: No significant disc displacement, foraminal stenosis, or canal
stenosis.

L5-S1: Interval disc desiccation, mild loss of intervertebral disc
space height, and left central disc protrusion with annular fissure.
Mild stenosis of the left lateral recess. No significant foraminal
or spinal canal stenosis.
IMPRESSION: Interval discogenic degenerative changes at the L5-S1 level with
loss of intervertebral disc space height and a small left central
disc protrusion with annular fissure resulting in mild left lateral
recess stenosis. No significant foraminal or spinal canal stenosis.
No acute osseous abnormality.

## 2019-10-30 ENCOUNTER — Ambulatory Visit: Payer: BC Managed Care – PPO | Attending: Family

## 2019-10-30 DIAGNOSIS — Z23 Encounter for immunization: Secondary | ICD-10-CM | POA: Insufficient documentation

## 2019-10-30 NOTE — Progress Notes (Signed)
   Covid-19 Vaccination Clinic  Name:  Ruth Lewis    MRN: [id number] DOB: 01-24-1996  10/30/2019  [name] was observed post Covid-19 immunization for 15 minutes without incidence. She was provided with Vaccine Information Sheet and instruction to access the V-Safe system.   [name] was instructed to call 911 with any severe reactions post vaccine: [name] Difficulty breathing  . Swelling of your face and throat  . A fast heartbeat  . A bad rash all over your body  . Dizziness and weakness    Immunizations Administered    Name Date Dose VIS Date Route   Moderna COVID-19 Vaccine 10/30/2019 12:34 PM 0.5 mL 08/08/2019 Intramuscular   Manufacturer: Moderna   Lot: [id number]   NDC: 18867-737-36

## 2019-12-05 ENCOUNTER — Ambulatory Visit: Payer: BC Managed Care – PPO | Attending: Family

## 2019-12-05 DIAGNOSIS — Z23 Encounter for immunization: Secondary | ICD-10-CM

## 2019-12-05 NOTE — Progress Notes (Signed)
   Covid-19 Vaccination Clinic  Name:  Ruth Lewis    MRN: [id number] DOB: [birth year]  12/05/2019  Ruth Lewis was observed post Covid-19 immunization for 15 minutes without incident. She was provided with Vaccine Information Sheet and instruction to access the V-Safe system.   Ruth Lewis was instructed to call 911 with any severe reactions post vaccine: [name] Difficulty breathing  . Swelling of face and throat  . A fast heartbeat  . A bad rash all over body  . Dizziness and weakness   Immunizations Administered    Name Date Dose VIS Date Route   Moderna COVID-19 Vaccine 12/05/2019 10:15 AM 0.5 mL 08/08/2019 Intramuscular   Manufacturer: Moderna   Lot: [id number]   NDC: 70177-939-03

## 2021-10-06 ENCOUNTER — Emergency Department (HOSPITAL_COMMUNITY)
Admission: EM | Admit: 2021-10-06 | Discharge: 2021-10-06 | Disposition: A | Discharge status: Home / Self Care | Payer: BC Managed Care – PPO | Attending: Emergency Medicine | Admitting: Emergency Medicine

## 2021-10-06 ENCOUNTER — Other Ambulatory Visit: Payer: Self-pay

## 2021-10-06 ENCOUNTER — Encounter (HOSPITAL_COMMUNITY): Payer: Self-pay

## 2021-10-06 ENCOUNTER — Ambulatory Visit (HOSPITAL_COMMUNITY)
Admission: EM | Admit: 2021-10-06 | Discharge: 2021-10-06 | Disposition: A | Discharge status: Other Acute Inpatient Hospital | Payer: BC Managed Care – PPO

## 2021-10-06 DIAGNOSIS — R112 Nausea with vomiting, unspecified: Secondary | ICD-10-CM | POA: Insufficient documentation

## 2021-10-06 DIAGNOSIS — N9489 Other specified conditions associated with female genital organs and menstrual cycle: Secondary | ICD-10-CM | POA: Diagnosis not present

## 2021-10-06 DIAGNOSIS — R197 Diarrhea, unspecified: Secondary | ICD-10-CM | POA: Insufficient documentation

## 2021-10-06 LAB — COMPREHENSIVE METABOLIC PANEL
ALT: 20 U/L (ref 0–44)
AST: 27 U/L (ref 15–41)
Albumin: 4.4 g/dL (ref 3.5–5.0)
Alkaline Phosphatase: 62 U/L (ref 38–126)
Anion gap: 16 — ABNORMAL HIGH (ref 5–15)
BUN: 9 mg/dL (ref 6–20)
CO2: 17 mmol/L — ABNORMAL LOW (ref 22–32)
Calcium: 9.7 mg/dL (ref 8.9–10.3)
Chloride: 106 mmol/L (ref 98–111)
Creatinine, Ser: 0.96 mg/dL (ref 0.44–1.00)
GFR, Estimated: >60 mL/min (ref >60)
Glucose, Bld: 122 mg/dL — ABNORMAL HIGH (ref 70–99)
Potassium: 3.2 mmol/L — ABNORMAL LOW (ref 3.5–5.1)
Sodium: 139 mmol/L (ref 135–145)
Total Bilirubin: 1.1 mg/dL (ref 0.3–1.2)
Total Protein: 8.1 g/dL (ref 6.5–8.1)

## 2021-10-06 LAB — CBC WITH DIFFERENTIAL/PLATELET
Abs Immature Granulocytes: 0.02 10*3/uL (ref 0.00–0.07)
Basophils Absolute: 0 10*3/uL (ref 0.0–0.1)
Basophils Relative: 0 %
Eosinophils Absolute: 0 10*3/uL (ref 0.0–0.5)
Eosinophils Relative: 0 %
HCT: 42.5 % (ref 36.0–46.0)
Hemoglobin: 14.3 g/dL (ref 12.0–15.0)
Immature Granulocytes: 0 %
Lymphocytes Relative: 10 %
Lymphs Abs: 0.6 10*3/uL — ABNORMAL LOW (ref 0.7–4.0)
MCH: 29.9 pg (ref 26.0–34.0)
MCHC: 33.6 g/dL (ref 30.0–36.0)
MCV: 88.9 fL (ref 80.0–100.0)
Monocytes Absolute: 0.3 10*3/uL (ref 0.1–1.0)
Monocytes Relative: 5 %
Neutro Abs: 4.8 10*3/uL (ref 1.7–7.7)
Neutrophils Relative %: 85 %
Platelets: 214 10*3/uL (ref 150–400)
RBC: 4.78 MIL/uL (ref 3.87–5.11)
RDW: 12.2 % (ref 11.5–15.5)
WBC: 5.7 10*3/uL (ref 4.0–10.5)
nRBC: 0 % (ref 0.0–0.2)

## 2021-10-06 LAB — I-STAT VENOUS BLOOD GAS, ED
Acid-base deficit: 8 mmol/L — ABNORMAL HIGH (ref 0.0–2.0)
Bicarbonate: 12.7 mmol/L — ABNORMAL LOW (ref 20.0–28.0)
Calcium, Ion: 0.93 mmol/L — ABNORMAL LOW (ref 1.15–1.40)
HCT: 40 % (ref 36.0–46.0)
Hemoglobin: 13.6 g/dL (ref 12.0–15.0)
O2 Saturation: 82 %
Potassium: 3.5 mmol/L (ref 3.5–5.1)
Sodium: 139 mmol/L (ref 135–145)
TCO2: 13 mmol/L — ABNORMAL LOW (ref 22–32)
pCO2, Ven: 16.8 mmHg — CL (ref 44.0–60.0)
pH, Ven: 7.486 — ABNORMAL HIGH (ref 7.250–7.430)
pO2, Ven: 41 mmHg (ref 32.0–45.0)

## 2021-10-06 LAB — I-STAT BETA HCG BLOOD, ED (MC, WL, AP ONLY): I-stat hCG, quantitative: <5 m[IU]/mL (ref <5)

## 2021-10-06 LAB — URINALYSIS, ROUTINE W REFLEX MICROSCOPIC
Bilirubin Urine: NEGATIVE
Glucose, UA: NEGATIVE mg/dL
Hgb urine dipstick: NEGATIVE
Ketones, ur: >80 mg/dL — AB
Leukocytes,Ua: NEGATIVE
Nitrite: NEGATIVE
Protein, ur: NEGATIVE mg/dL
Specific Gravity, Urine: 1.025 (ref 1.005–1.030)
pH: 6 (ref 5.0–8.0)

## 2021-10-06 LAB — SALICYLATE LEVEL: Salicylate Lvl: <7 mg/dL — ABNORMAL LOW (ref 7.0–30.0)

## 2021-10-06 LAB — LIPASE, BLOOD: Lipase: 24 U/L (ref 11–51)

## 2021-10-06 LAB — ACETAMINOPHEN LEVEL: Acetaminophen (Tylenol), Serum: <10 ug/mL — ABNORMAL LOW (ref 10–30)

## 2021-10-06 LAB — OSMOLALITY: Osmolality: 288 mOsm/kg (ref 275–295)

## 2021-10-06 MED ORDER — ONDANSETRON HCL 4 MG/2ML IJ SOLN
4.0000 mg | Freq: Once | INTRAMUSCULAR | Status: AC
  Administered 2021-10-06: 4 mg via INTRAVENOUS
  Filled 2021-10-06: qty 2

## 2021-10-06 MED ORDER — HALOPERIDOL LACTATE 5 MG/ML IJ SOLN
5.0000 mg | Freq: Once | INTRAMUSCULAR | Status: AC
  Administered 2021-10-06: 5 mg via INTRAVENOUS
  Filled 2021-10-06: qty 1

## 2021-10-06 MED ORDER — SODIUM CHLORIDE 0.9 % IV BOLUS
1000.0000 mL | Freq: Once | INTRAVENOUS | Status: AC
  Administered 2021-10-06: 1000 mL via INTRAVENOUS

## 2021-10-06 MED ORDER — FENTANYL CITRATE PF 50 MCG/ML IJ SOSY
25.0000 ug | PREFILLED_SYRINGE | Freq: Once | INTRAMUSCULAR | Status: AC
  Administered 2021-10-06: 25 ug via INTRAVENOUS
  Filled 2021-10-06: qty 1

## 2021-10-06 MED ORDER — ONDANSETRON 4 MG PO TBDP: 4.0000 mg | ORAL_TABLET | Freq: Three times a day (TID) | ORAL | 0 refills | Status: AC | PRN

## 2021-10-06 MED ORDER — DIPHENHYDRAMINE HCL 50 MG/ML IJ SOLN
12.5000 mg | Freq: Once | INTRAMUSCULAR | Status: AC
  Administered 2021-10-06: 12.5 mg via INTRAVENOUS
  Filled 2021-10-06: qty 1

## 2021-10-06 NOTE — ED Provider Notes (Signed)
[facility]   CSN: [id number] Arrival date & time: 10/06/21  0901     History  Chief Complaint  Patient presents with   Emesis    Phil Ufoma Gerald [place] is a 26 y.o. female.  26 year old otherwise healthy female presents with complaint of sudden onset of nausea, vomiting, diarrhea, abdominal cramping at 3 AM today.  States that she did go out to eat last night and had salmon around 5 or 6 PM and felt well until this started at 3 AM.  Reports numerous episodes of emesis and loose stools, no blood seen.  Recent travel, no known sick contacts.      Home Medications Prior to Admission medications   Medication Sig Start Date End Date Taking? Authorizing Provider  ondansetron (ZOFRAN-ODT) 4 MG disintegrating tablet Take 1 tablet (4 mg total) by mouth every 8 (eight) hours as needed for nausea or vomiting. 10/06/21  Yes Jeannie FendMurphy, Maesyn Frisinger A, PA-C  albuterol (PROVENTIL HFA;VENTOLIN HFA) 108 (90 Base) MCG/ACT inhaler Inhale 2 puffs into the lungs every 6 (six) hours as needed for wheezing or shortness of breath.    [provider]  etonogestrel (NEXPLANON) 68 MG IMPL implant 1 each by Subdermal route once.    [provider]  omeprazole (PRILOSEC) 20 MG capsule TAKE 1 CAPSULE(20 MG) BY MOUTH DAILY 05/22/19   Willow OraAndy, Camille L, MD      Allergies    Patient has no known allergies.    Review of Systems   Review of Systems  Constitutional:  Negative for chills and fever.  Respiratory:  Negative for shortness of breath.   Cardiovascular:  Negative for chest pain.  Gastrointestinal:  Positive for abdominal pain, diarrhea, nausea and vomiting. Negative for blood in stool and constipation.  Genitourinary:  Negative for dysuria.  Musculoskeletal:  Negative for arthralgias and myalgias.  Skin:  Negative for rash and wound.  Allergic/Immunologic: Negative for immunocompromised state.  Neurological:  Positive for weakness.   Psychiatric/Behavioral:  Negative for confusion.   All other systems reviewed and are negative.  Physical Exam Updated Vital Signs BP 108/68 (BP Location: Right Arm)    Pulse (!) 105    Temp 98 F (36.7 C) (Oral)    Resp 18    Ht 5\' 9"  (1.753 m)    SpO2 97%    BMI 23.78 kg/m  Physical Exam Vitals and nursing note reviewed.  Constitutional:      General: She is not in acute distress.    Appearance: She is well-developed. She is not diaphoretic.  HENT:     Head: Normocephalic and atraumatic.  Eyes:     Conjunctiva/sclera: Conjunctivae normal.  Cardiovascular:     Rate and Rhythm: Normal rate and regular rhythm.     Heart sounds: Normal heart sounds.  Pulmonary:     Breath sounds: Normal breath sounds.     Comments: Hyperventilating, encouraged to slow respiratory rate. Abdominal:     Palpations: Abdomen is soft.     Tenderness: There is no abdominal tenderness.  Skin:    General: Skin is warm and dry.     Findings: No erythema or rash.  Neurological:     Mental Status: She is alert and oriented to person, place, and time.  Psychiatric:        Behavior: Behavior normal.    ED Results / Procedures / Treatments   Labs (all labs ordered are listed, but only abnormal results are displayed) Labs Reviewed  CBC WITH DIFFERENTIAL/PLATELET - Abnormal; Notable for the following components:      Result Value   Lymphs Abs 0.6 (*)    All other components within normal limits  COMPREHENSIVE METABOLIC PANEL - Abnormal; Notable for the following components:   Potassium 3.2 (*)    CO2 17 (*)    Glucose, Bld 122 (*)    Anion gap 16 (*)    All other components within normal limits  URINALYSIS, ROUTINE W REFLEX MICROSCOPIC - Abnormal; Notable for the following components:   Ketones, ur >80 (*)    All other components within normal limits  SALICYLATE LEVEL - Abnormal; Notable for the following components:   Salicylate Lvl <7.0 (*)    All other components within normal limits   ACETAMINOPHEN LEVEL - Abnormal; Notable for the following components:   Acetaminophen (Tylenol), Serum <10 (*)    All other components within normal limits  I-STAT VENOUS BLOOD GAS, ED - Abnormal; Notable for the following components:   pH, Ven 7.486 (*)    pCO2, Ven 16.8 (*)    Bicarbonate 12.7 (*)    TCO2 13 (*)    Acid-base deficit 8.0 (*)    Calcium, Ion 0.93 (*)    All other components within normal limits  LIPASE, BLOOD  OSMOLALITY  I-STAT BETA HCG BLOOD, ED ([facility], [facility], [facility] ONLY)    EKG EKG Interpretation  Date/Time:  Monday October 06 2021 12:56:02 EST Ventricular Rate:  68 PR Interval:  212 QRS Duration: 110 QT Interval:  462 QTC Calculation: 510 R Axis:   79 Text Interpretation: Sinus rhythm Ventricular premature complex Borderline prolonged PR interval Borderline T abnormalities, anterior leads Prolonged QT interval Interpretation limited secondary to artifact Confirmed by Tanda Rockers ([id number]) on 10/06/2021 12:58:27 PM  Radiology No results found.  Procedures Procedures    Medications Ordered in ED Medications  ondansetron (ZOFRAN) injection 4 mg (4 mg Intravenous Given 10/06/21 1013)  sodium chloride 0.9 % bolus 1,000 mL (0 mLs Intravenous Stopped 10/06/21 1218)  fentaNYL (SUBLIMAZE) injection 25 mcg (25 mcg Intravenous Given 10/06/21 1116)  sodium chloride 0.9 % bolus 1,000 mL (0 mLs Intravenous Stopped 10/06/21 1544)  haloperidol lactate (HALDOL) injection 5 mg (5 mg Intravenous Given 10/06/21 1332)  diphenhydrAMINE (BENADRYL) injection 12.5 mg (12.5 mg Intravenous Given 10/06/21 1331)    ED Course/ Medical Decision Making/ A&P Clinical Course as of 10/06/21 1604  Mon Oct 06, 2021  519 26 year old female with nausea, vomiting and diarrhea as above.  Patient was given Zofran and IV fluids, continued to have vomiting and was given Haldol with Benadryl.  Symptoms have resolved, she is tolerating oral fluids and feels safe for discharge home at this point.   Vitals  reviewed, patient was tachycardic with heart rate of 120, this did improve with IV fluids.  She was hypothermic with a rectal temperature of 96.3, this is improved with warmer. Patient's CBC is within normal meds, CMP with mild hypokalemia with potassium to 3.2, vomiting and diarrhea have stopped and she is tolerating oral fluids, does not need replacement this time.  Urinalysis with ketones secondary to her vomiting today.  Normal serum osmolality.  I-STAT VBG with PCO2 of 16.8 likely secondary to her hyperventilating today. Patient is discharged with Zofran, advised return to ED if her symptoms return otherwise follow-up with her primary care provider for recheck. [LM]    Clinical Course User Index [LM] Jeannie Fend, PA-C  Medical Decision Making Amount and/or Complexity of Data Reviewed Labs: ordered.  Risk Prescription drug management.           Final Clinical Impression(s) / ED Diagnoses Final diagnoses:  Nausea vomiting and diarrhea    Rx / DC Orders ED Discharge Orders          Ordered    ondansetron (ZOFRAN-ODT) 4 MG disintegrating tablet  Every 8 hours PRN        10/06/21 1548              Jeannie Fend, PA-C 10/06/21 1604    Sloan Leiter, DO 10/06/21 1730

## 2021-10-06 NOTE — Discharge Planning (Signed)
RNCM met with pt and mom at beside regarding disposition needs. No needs identified at this time.

## 2021-10-06 NOTE — ED Notes (Signed)
2nd attempt to get EKG and vitals, pt unable to stay still will wait for meds to kick in and try again

## 2021-10-06 NOTE — Discharge Instructions (Signed)
Zofran as needed as prescribed for nausea and vomiting.  Return to the emergency for worsening or concerning symptoms.  Follow-up with your primary care provider for recheck.

## 2021-10-06 NOTE — ED Notes (Signed)
Pt took off blood pressure cuff and refused to allow staff to get vital signs.

## 2021-10-06 NOTE — ED Notes (Signed)
Pt passed PO challenge and is tolerating crackers and water at this time

## 2021-10-06 NOTE — Discharge Planning (Signed)
RNCM following for disposition needs. °Shanta Hartner J. Cherese Lozano, RN, BSN, NCM [phone] ° °

## 2022-04-10 ENCOUNTER — Other Ambulatory Visit: Payer: Self-pay | Admitting: Obstetrics and Gynecology

## 2022-04-14 ENCOUNTER — Other Ambulatory Visit: Payer: Self-pay | Admitting: Family Medicine

## 2022-05-19 ENCOUNTER — Emergency Department (HOSPITAL_COMMUNITY)
Admission: EM | Admit: 2022-05-19 | Discharge: 2022-05-20 | Disposition: A | Discharge status: Home / Self Care | Payer: BC Managed Care – PPO | Attending: Emergency Medicine | Admitting: Emergency Medicine

## 2022-05-19 ENCOUNTER — Other Ambulatory Visit: Payer: Self-pay

## 2022-05-19 ENCOUNTER — Encounter (HOSPITAL_COMMUNITY): Payer: Self-pay

## 2022-05-19 DIAGNOSIS — F410 Panic disorder [episodic paroxysmal anxiety] without agoraphobia: Secondary | ICD-10-CM | POA: Diagnosis not present

## 2022-05-19 DIAGNOSIS — E860 Dehydration: Secondary | ICD-10-CM | POA: Insufficient documentation

## 2022-05-19 DIAGNOSIS — R112 Nausea with vomiting, unspecified: Secondary | ICD-10-CM | POA: Insufficient documentation

## 2022-05-19 DIAGNOSIS — R109 Unspecified abdominal pain: Secondary | ICD-10-CM | POA: Insufficient documentation

## 2022-05-19 DIAGNOSIS — F419 Anxiety disorder, unspecified: Secondary | ICD-10-CM | POA: Diagnosis present

## 2022-05-19 LAB — COMPREHENSIVE METABOLIC PANEL
ALT: 35 U/L (ref 0–44)
AST: 52 U/L — ABNORMAL HIGH (ref 15–41)
Albumin: 4.8 g/dL (ref 3.5–5.0)
Alkaline Phosphatase: 58 U/L (ref 38–126)
Anion gap: 20 — ABNORMAL HIGH (ref 5–15)
BUN: 12 mg/dL (ref 6–20)
CO2: 16 mmol/L — ABNORMAL LOW (ref 22–32)
Calcium: 10.3 mg/dL (ref 8.9–10.3)
Chloride: 102 mmol/L (ref 98–111)
Creatinine, Ser: 1.27 mg/dL — ABNORMAL HIGH (ref 0.44–1.00)
GFR, Estimated: >60 mL/min (ref >60)
Glucose, Bld: 104 mg/dL — ABNORMAL HIGH (ref 70–99)
Potassium: 3 mmol/L — ABNORMAL LOW (ref 3.5–5.1)
Sodium: 138 mmol/L (ref 135–145)
Total Bilirubin: 1.6 mg/dL — ABNORMAL HIGH (ref 0.3–1.2)
Total Protein: 8.2 g/dL — ABNORMAL HIGH (ref 6.5–8.1)

## 2022-05-19 LAB — CBC
HCT: 44.5 % (ref 36.0–46.0)
Hemoglobin: 15 g/dL (ref 12.0–15.0)
MCH: 30 pg (ref 26.0–34.0)
MCHC: 33.7 g/dL (ref 30.0–36.0)
MCV: 89 fL (ref 80.0–100.0)
Platelets: 228 10*3/uL (ref 150–400)
RBC: 5 MIL/uL (ref 3.87–5.11)
RDW: 12.6 % (ref 11.5–15.5)
WBC: 7 10*3/uL (ref 4.0–10.5)
nRBC: 0 % (ref 0.0–0.2)

## 2022-05-19 LAB — I-STAT BETA HCG BLOOD, ED (MC, WL, AP ONLY): I-stat hCG, quantitative: <5 m[IU]/mL (ref <5)

## 2022-05-19 LAB — ETHANOL: Alcohol, Ethyl (B): <10 mg/dL (ref <10)

## 2022-05-19 MED ORDER — LORAZEPAM 2 MG/ML IJ SOLN
1.0000 mg | Freq: Once | INTRAMUSCULAR | Status: AC
Start: 2022-05-19 — End: 2022-05-19
  Administered 2022-05-19: 1 mg via INTRAVENOUS
  Filled 2022-05-19: qty 1

## 2022-05-19 MED ORDER — SODIUM CHLORIDE 0.9 % IV BOLUS
1000.0000 mL | Freq: Once | INTRAVENOUS | Status: AC
  Administered 2022-05-19: 1000 mL via INTRAVENOUS

## 2022-05-19 NOTE — ED Notes (Signed)
Patient notified of the need for a urine sample patient doesn't respond with verbal understanding x2

## 2022-05-19 NOTE — ED Triage Notes (Addendum)
Presents from home with mother. Pt states that she is trying to "detox from nicotine". Endorses marijuana a few nights ago, denies any other drug use. Arrives shaking, anxious, yelling out, breathing rapidly. Keeps stating "I need to be put to sleep, I need to sleep, let me sleep." Also would like staff to treat abd pain that has been present since February.  Denies HI/SI.
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
## 2022-05-19 NOTE — ED Notes (Signed)
Pt does not want family to have updates about care. Mother is aware that pt has requested this, states she will remain in lobby and be available if patient changes mind.

## 2022-05-20 NOTE — ED Notes (Signed)
Patient reports that she cannot urinate at this time.

## 2022-05-20 NOTE — ED Provider Notes (Signed)
[facility] EMERGENCY DEPARTMENT Provider Note   CSN: [id number] Arrival date & time: 05/19/22  2219     History  Chief Complaint  Patient presents with   Anxiety   Shortness of Breath    Ruth Lewis is a 26 y.o. female.  Patient presents to the emergency department with multiple complaints.  Patient presented tonight reporting that she was withdrawing from nicotine.  She was found to be extremely anxious and agitated, yelling out, breathing rapidly.  She denies drug use other than occasional delta 8 and delta 9, none today.  Patient does report that she has had nausea and vomiting today and abdominal pain that has been present since February of this year.       Home Medications Prior to Admission medications   Medication Sig Start Date End Date Taking? Authorizing Provider  albuterol (PROVENTIL HFA;VENTOLIN HFA) 108 (90 Base) MCG/ACT inhaler Inhale 2 puffs into the lungs every 6 (six) hours as needed for wheezing or shortness of breath.    [provider]  etonogestrel (NEXPLANON) 68 MG IMPL implant 1 each by Subdermal route once.    [provider]  omeprazole (PRILOSEC) 20 MG capsule TAKE 1 CAPSULE(20 MG) BY MOUTH DAILY 05/22/19   [name], MD  ondansetron (ZOFRAN-ODT) 4 MG disintegrating tablet Take 1 tablet (4 mg total) by mouth every 8 (eight) hours as needed for nausea or vomiting. 10/06/21   Jeannie Fend, PA-C      Allergies    Patient has no known allergies.    Review of Systems   Review of Systems  Physical Exam Updated Vital Signs BP 98/77   Pulse (!) 58   Temp 99.2 F (37.3 C) (Oral)   Resp 17   Ht 5\' 9"  (1.753 m)   Wt 59 kg   SpO2 99%   BMI 19.20 kg/m  Physical Exam Vitals and nursing note reviewed.  Constitutional:      General: She is not in acute distress.    Appearance: She is well-developed.  HENT:     Head: Normocephalic and atraumatic.     Mouth/Throat:     Mouth: Mucous membranes are dry.   Eyes:     General: Vision grossly intact. Gaze aligned appropriately.     Extraocular Movements: Extraocular movements intact.     Conjunctiva/sclera: Conjunctivae normal.  Cardiovascular:     Rate and Rhythm: Normal rate and regular rhythm.     Pulses: Normal pulses.     Heart sounds: Normal heart sounds, S1 normal and S2 normal. No murmur heard.    No friction rub. No gallop.  Pulmonary:     Effort: Pulmonary effort is normal. No respiratory distress.     Breath sounds: Normal breath sounds.  Abdominal:     General: Bowel sounds are normal.     Palpations: Abdomen is soft.     Tenderness: There is no abdominal tenderness. There is no guarding or rebound.     Hernia: No hernia is present.  Musculoskeletal:        General: No swelling.     Cervical back: Full passive range of motion without pain, normal range of motion and neck supple. No spinous process tenderness or muscular tenderness. Normal range of motion.     Right lower leg: No edema.     Left lower leg: No edema.  Skin:    General: Skin is warm and dry.     Capillary Refill: Capillary refill takes  less than 2 seconds.     Findings: No ecchymosis, erythema, rash or wound.  Neurological:     General: No focal deficit present.     Mental Status: She is alert and oriented to person, place, and time.     GCS: GCS eye subscore is 4. GCS verbal subscore is 5. GCS motor subscore is 6.     Cranial Nerves: Cranial nerves 2-12 are intact.     Sensory: Sensation is intact.     Motor: Motor function is intact.     Coordination: Coordination is intact.  Psychiatric:        Attention and Perception: Attention normal.        Mood and Affect: Mood is anxious.        Speech: Speech is rapid and pressured.        Behavior: Behavior is agitated.        Thought Content: Thought content does not include homicidal or suicidal ideation.     ED Results / Procedures / Treatments   Labs (all labs ordered are listed, but only abnormal  results are displayed) Labs Reviewed  COMPREHENSIVE METABOLIC PANEL - Abnormal; Notable for the following components:      Result Value   Potassium 3.0 (*)    CO2 16 (*)    Glucose, Bld 104 (*)    Creatinine, Ser 1.27 (*)    Total Protein 8.2 (*)    AST 52 (*)    Total Bilirubin 1.6 (*)    Anion gap 20 (*)    All other components within normal limits  ETHANOL  CBC  RAPID URINE DRUG SCREEN, HOSP PERFORMED  I-STAT BETA HCG BLOOD, ED ([facility], [facility], [facility] ONLY)    EKG EKG Interpretation  Date/Time:  Tuesday May 19 2022 22:57:33 EDT Ventricular Rate:  63 PR Interval:  152 QRS Duration: 90 QT Interval:  489 QTC Calculation: 501 R Axis:   98 Text Interpretation: Sinus rhythm Right atrial enlargement Borderline right axis deviation Prolonged QT interval Confirmed by Gilda Crease [phone]) on 05/19/2022 11:29:06 PM  Radiology No results found.  Procedures Procedures    Medications Ordered in ED Medications  LORazepam (ATIVAN) injection 1 mg (1 mg Intravenous Given 05/19/22 2254)  sodium chloride 0.9 % bolus 1,000 mL (0 mLs Intravenous Stopped 05/20/22 0150)    ED Course/ Medical Decision Making/ A&P                           Medical Decision Making Amount and/or Complexity of Data Reviewed Labs: ordered.   Presented to the emergency department for evaluation of agitation.  Patient appeared to be extremely anxious, experiencing panic attack at arrival.  She reports that she has been having nausea and vomiting.  She did appear to have some dry mucous membranes, was administered IV fluids.  No vomiting overnight.  Lab work reassuring.  Administered Ativan on arrival for anxiety and has slept through the night without further complaints.  She is arousable this morning, reports no further nausea, abdominal pain has resolved.  Homicidal or suicidal.  Appropriate for discharge.        Final Clinical Impression(s) / ED Diagnoses Final diagnoses:  Panic attack   Dehydration    Rx / DC Orders ED Discharge Orders     None         Gilda Crease, MD 05/20/22 [phone]

## 2022-05-20 NOTE — ED Notes (Signed)
Pt laying in bed awake, states she still is unable to give urine sample

## 2022-05-20 NOTE — ED Notes (Signed)
Pt mother is going to go out to her car to wait, has her phone with her, ensured that number listed is correct Kary Kos)
